# Patient Record
Sex: Female | Born: 1955 | Race: White | Hispanic: No | Marital: Married | Smoking: Former smoker
Health system: Southern US, Community
[De-identification: ages and names within clinical notes are randomized; demographics above are authoritative.]

## PROBLEM LIST (undated history)

## (undated) DIAGNOSIS — Z9289 Personal history of other medical treatment: Secondary | ICD-10-CM

## (undated) DIAGNOSIS — R600 Localized edema: Secondary | ICD-10-CM

## (undated) DIAGNOSIS — I10 Essential (primary) hypertension: Secondary | ICD-10-CM

## (undated) DIAGNOSIS — F319 Bipolar disorder, unspecified: Secondary | ICD-10-CM

## (undated) DIAGNOSIS — E039 Hypothyroidism, unspecified: Secondary | ICD-10-CM

## (undated) HISTORY — DX: Personal history of other medical treatment: Z92.89

## (undated) HISTORY — DX: Bipolar disorder, unspecified: F31.9

## (undated) HISTORY — DX: Hypothyroidism, unspecified: E03.9

## (undated) HISTORY — PX: BREAST EXCISIONAL BIOPSY: SUR124

## (undated) HISTORY — PX: WISDOM TOOTH EXTRACTION: SHX21

## (undated) HISTORY — PX: BREAST BIOPSY: SHX20

## (undated) HISTORY — DX: Localized edema: R60.0

## (undated) HISTORY — DX: Essential (primary) hypertension: I10

---

## 1999-04-18 ENCOUNTER — Other Ambulatory Visit: Admission: RE | Admit: 1999-04-18 | Discharge: 1999-04-18 | Payer: Self-pay | Admitting: Obstetrics and Gynecology

## 1999-06-04 ENCOUNTER — Emergency Department (HOSPITAL_COMMUNITY): Admission: EM | Admit: 1999-06-04 | Discharge: 1999-06-04 | Payer: Self-pay | Admitting: Internal Medicine

## 1999-12-05 ENCOUNTER — Encounter: Payer: Self-pay | Admitting: Obstetrics and Gynecology

## 1999-12-05 ENCOUNTER — Encounter: Admission: RE | Admit: 1999-12-05 | Discharge: 1999-12-05 | Payer: Self-pay | Admitting: Obstetrics and Gynecology

## 1999-12-08 ENCOUNTER — Encounter: Admission: RE | Admit: 1999-12-08 | Discharge: 1999-12-08 | Payer: Self-pay | Admitting: Obstetrics and Gynecology

## 1999-12-08 ENCOUNTER — Encounter: Payer: Self-pay | Admitting: Obstetrics and Gynecology

## 2000-03-01 ENCOUNTER — Other Ambulatory Visit: Admission: RE | Admit: 2000-03-01 | Discharge: 2000-03-01 | Payer: Self-pay | Admitting: Obstetrics and Gynecology

## 2000-09-03 ENCOUNTER — Ambulatory Visit (HOSPITAL_COMMUNITY): Admission: RE | Admit: 2000-09-03 | Discharge: 2000-09-03 | Payer: Self-pay | Admitting: Gastroenterology

## 2000-12-27 ENCOUNTER — Encounter: Payer: Self-pay | Admitting: Obstetrics and Gynecology

## 2000-12-27 ENCOUNTER — Encounter: Admission: RE | Admit: 2000-12-27 | Discharge: 2000-12-27 | Payer: Self-pay | Admitting: Obstetrics and Gynecology

## 2001-01-01 ENCOUNTER — Encounter: Admission: RE | Admit: 2001-01-01 | Discharge: 2001-01-01 | Payer: Self-pay | Admitting: Obstetrics and Gynecology

## 2001-01-01 ENCOUNTER — Encounter: Payer: Self-pay | Admitting: Obstetrics and Gynecology

## 2001-03-04 ENCOUNTER — Other Ambulatory Visit: Admission: RE | Admit: 2001-03-04 | Discharge: 2001-03-04 | Payer: Self-pay | Admitting: Obstetrics and Gynecology

## 2001-05-17 ENCOUNTER — Encounter: Admission: RE | Admit: 2001-05-17 | Discharge: 2001-05-17 | Payer: Self-pay | Admitting: Obstetrics and Gynecology

## 2001-05-17 ENCOUNTER — Encounter: Payer: Self-pay | Admitting: Obstetrics and Gynecology

## 2002-01-08 ENCOUNTER — Encounter: Admission: RE | Admit: 2002-01-08 | Discharge: 2002-01-08 | Payer: Self-pay | Admitting: Obstetrics and Gynecology

## 2002-01-08 ENCOUNTER — Encounter: Payer: Self-pay | Admitting: Obstetrics and Gynecology

## 2003-01-12 ENCOUNTER — Encounter: Admission: RE | Admit: 2003-01-12 | Discharge: 2003-01-12 | Payer: Self-pay | Admitting: Obstetrics and Gynecology

## 2003-01-12 ENCOUNTER — Encounter: Payer: Self-pay | Admitting: Obstetrics and Gynecology

## 2003-06-29 ENCOUNTER — Encounter: Payer: Self-pay | Admitting: Obstetrics and Gynecology

## 2003-06-29 ENCOUNTER — Encounter: Admission: RE | Admit: 2003-06-29 | Discharge: 2003-06-29 | Payer: Self-pay | Admitting: Obstetrics and Gynecology

## 2004-03-09 ENCOUNTER — Encounter: Admission: RE | Admit: 2004-03-09 | Discharge: 2004-03-09 | Payer: Self-pay | Admitting: Obstetrics and Gynecology

## 2004-06-09 ENCOUNTER — Encounter: Admission: RE | Admit: 2004-06-09 | Discharge: 2004-06-09 | Payer: Self-pay | Admitting: Obstetrics and Gynecology

## 2004-09-23 HISTORY — PX: US ECHOCARDIOGRAPHY: HXRAD669

## 2005-05-24 ENCOUNTER — Encounter: Admission: RE | Admit: 2005-05-24 | Discharge: 2005-05-24 | Payer: Self-pay | Admitting: Internal Medicine

## 2006-05-28 ENCOUNTER — Encounter: Admission: RE | Admit: 2006-05-28 | Discharge: 2006-05-28 | Payer: Self-pay | Admitting: Obstetrics and Gynecology

## 2007-02-08 ENCOUNTER — Encounter: Admission: RE | Admit: 2007-02-08 | Discharge: 2007-02-08 | Payer: Self-pay | Admitting: Obstetrics and Gynecology

## 2007-07-04 ENCOUNTER — Encounter: Admission: RE | Admit: 2007-07-04 | Discharge: 2007-07-04 | Payer: Self-pay | Admitting: Obstetrics and Gynecology

## 2008-07-08 ENCOUNTER — Encounter: Admission: RE | Admit: 2008-07-08 | Discharge: 2008-07-08 | Payer: Self-pay | Admitting: Obstetrics and Gynecology

## 2009-07-13 ENCOUNTER — Encounter: Admission: RE | Admit: 2009-07-13 | Discharge: 2009-07-13 | Payer: Self-pay | Admitting: Obstetrics and Gynecology

## 2010-07-13 ENCOUNTER — Ambulatory Visit: Payer: Self-pay | Admitting: Cardiovascular Disease

## 2010-07-13 ENCOUNTER — Encounter: Admission: RE | Admit: 2010-07-13 | Discharge: 2010-07-13 | Payer: Self-pay | Admitting: Obstetrics and Gynecology

## 2010-07-26 ENCOUNTER — Telehealth (INDEPENDENT_AMBULATORY_CARE_PROVIDER_SITE_OTHER): Payer: Self-pay | Admitting: *Deleted

## 2010-07-27 ENCOUNTER — Encounter: Payer: Self-pay | Admitting: Cardiology

## 2010-07-27 ENCOUNTER — Encounter (HOSPITAL_COMMUNITY)
Admission: RE | Admit: 2010-07-27 | Discharge: 2010-10-07 | Payer: Self-pay | Source: Home / Self Care | Admitting: Cardiovascular Disease

## 2010-07-27 ENCOUNTER — Ambulatory Visit: Payer: Self-pay | Admitting: Cardiology

## 2010-07-27 ENCOUNTER — Ambulatory Visit: Payer: Self-pay

## 2010-07-27 HISTORY — PX: CARDIOVASCULAR STRESS TEST: SHX262

## 2010-09-30 ENCOUNTER — Ambulatory Visit: Payer: Self-pay | Admitting: Cardiovascular Disease

## 2010-12-06 NOTE — Progress Notes (Signed)
Summary: nuc pre procedure  Phone Note Outgoing Call Call back at Home Phone [phone]   Call placed by: Cathlyn Parsons RN,  July 26, 2010 3:04 PM Call placed to: Patient Reason for Call: Confirm/change Appt Summary of Call: Reviewed information on Myoview Information Sheet (see scanned document for further details).  Spoke with patient.          Nuclear Med Background Indications for Stress Test: Evaluation for Ischemia   History: Myocardial Perfusion Study  History Comments: 05 MPS:NL  Symptoms: Chest Pain    Nuclear Pre-Procedure Cardiac Risk Factors: Hypertension

## 2010-12-06 NOTE — Assessment & Plan Note (Signed)
Summary: Cardiology Nuclear Testing  Nuclear Med Background Indications for Stress Test: Evaluation for Ischemia   History: Myocardial Perfusion Study  History Comments: 05 MPS:NL  Symptoms: Chest Pain, Chest Pain with Exertion, DOE, Fatigue with Exertion, Rapid HR    Nuclear Pre-Procedure Cardiac Risk Factors: Family History - CAD, History of Smoking, Hypertension, Lipids Caffeine/Decaff Intake: None NPO After: 8:00 PM Lungs: Clear IV 0.9% NS with Angio Cath: 22g     IV Site: R Hand IV Started by: Irean Hong, RN Chest Size (in) 36-38     Cup Size C     Height (in): 67 Weight (lb): 179 BMI: 28.14 Tech Comments: Held toprol 24 hrs.  Nuclear Med Study 1 or 2 day study:  1 day     Stress Test Type:  Stress Reading MD:  Marca Ancona, MD     Referring MD:  Demetrius Charity Nahser Resting Radionuclide:  Technetium 20m Tetrofosmin     Resting Radionuclide Dose:  11 mCi  Stress Radionuclide:  Technetium 43m Tetrofosmin     Stress Radionuclide Dose:  33 mCi   Stress Protocol Exercise Time (min):  6:15 min     Max HR:  150 bpm     Predicted Max HR:  166 bpm  Max Systolic BP: 174 mm Hg     Percent Max HR:  90.36 %     METS: 7.3 Rate Pressure Product:  [address]    Stress Test Technologist:  Irean Hong,  RN     Nuclear Technologist:  Domenic Polite, CNMT  Rest Procedure  Myocardial perfusion imaging was performed at rest 45 minutes following the intravenous administration of Technetium 37m Tetrofosmin.  Stress Procedure  The patient exercised for 6 minutes and 15 seconds, RPE=15.  The patient stopped due to DOE,  and complained of chest tightness 3/10.  There were nonspecific  ST-T wave changes with exercise, rare PVC. The baseline EKG had nonspecific T-wave changes.  Technetium 79m Tetrofosmin was injected at peak exercise and myocardial perfusion imaging was performed after a brief delay.  QPS Raw Data Images:  Normal; no motion artifact; normal heart/lung ratio. Stress Images:  Normal  homogeneous uptake in all areas of the myocardium. Rest Images:  Normal homogeneous uptake in all areas of the myocardium. Subtraction (SDS):  There is no evidence of scar or ischemia. Transient Ischemic Dilatation:  .98  (Normal <1.22)  Lung/Heart Ratio:  .29  (Normal <0.45)  Quantitative Gated Spect Images QGS EDV:  56 ml QGS ESV:  14 ml QGS EF:  74 % QGS cine images:  Normal wall motion.    Overall Impression  Exercise Capacity: Fair exercise capacity. BP Response: Normal blood pressure response. Clinical Symptoms: Chest tightness.  ECG Impression: Insignificant upsloping ST segment depression. Overall Impression: Normal stress nuclear study.

## 2011-03-24 NOTE — Procedures (Signed)
[facility]  Patient:    Kristina Tran, Kristina Tran                      MRN: [id number] Proc. Date: 09/03/00 Attending:  Verlin Grills, M.D. CC:         Malachi Pro. Ambrose Mantle, M.D.  Thora Lance, M.D.   Procedure Report  REFERRING PHYSICIAN:  Malachi Pro. Ambrose Mantle, M.D.  PROCEDURE:  Colonoscopy.  PROCEDURE INDICATION:  Ms. Kristina Tran is a 55 year old female.  She denies a personal or family history for the presence of colorectal cancer. Her mother has undergone colonoscopic exams to remove neoplastic polyps to prevent colon cancer.  She has a maternal aunt, who was diagnosed with colon cancer in her 74s.  Ms. Kristina Tran is referred for colorectal neoplasia screening based on her mothers family history of neoplastic but noncancerous colon polyps.  Ms. Kristina Tran viewed our colonoscopy education film, and I discussed with her the complications associated with colonoscopy and polypectomy, including intestinal bleeding and intestinal perforation.  Ms. Kristina Tran has signed the operative permit.  MEDICATION ALLERGIES:  PENICILLIN and MYCIN DRUGS.  CHRONIC MEDICATIONS:  Wellbutrin, Depakote, Synthroid, and birth control pills.  PAST MEDICAL HISTORY:  Bipolar disorder, lithium-induced polydipsia, hypothyroidism, benign breast biopsy performed by Sandria Bales. Ezzard Standing, M.D.  ENDOSCOPIST:  Verlin Grills, M.D.  PREMEDICATION:  Demerol 100 mg, Versed 15 mg.  ENDOSCOPE:  Olympus pediatric colonoscope.  DESCRIPTION OF PROCEDURE:  After obtaining informed consent, the patient was placed in the left lateral decubitus position.  I administered intravenous Demerol and intravenous Versed to achieve conscious sedation for the procedure.  The patients blood pressure, oxygen saturation, and cardiac rhythm were monitored throughout the procedure and documented in the medical record.  Anal inspection was normal.  Digital rectal exam was normal.  The  Olympus pediatric video colonoscope was introduced into the rectum and under direct vision advanced to the cecum, as identified by a normal-appearing ileocecal valve.  Colonic preparation for the exam today was excellent.  Rectum normal.  Sigmoid colon and descending colon normal.  Splenic flexure normal.  Transverse colon normal.  Hepatic flexure normal.  Ascending colon normal.  Cecum and ileocecal valve normal.  ASSESSMENT:  Normal proctocolonoscopy to the cecum.  There is no evidence of presence of colorectal neoplasia.  RECOMMENDATIONS:  Repeat colonoscopy at age 29. DD:  09/03/00 TD:  09/03/00 Job: [id number]

## 2011-05-22 ENCOUNTER — Other Ambulatory Visit: Payer: Self-pay | Admitting: *Deleted

## 2011-05-22 DIAGNOSIS — Z1231 Encounter for screening mammogram for malignant neoplasm of breast: Secondary | ICD-10-CM

## 2011-07-17 ENCOUNTER — Ambulatory Visit: Payer: Self-pay

## 2011-07-21 ENCOUNTER — Other Ambulatory Visit: Payer: Self-pay | Admitting: Internal Medicine

## 2011-07-21 ENCOUNTER — Ambulatory Visit
Admission: RE | Admit: 2011-07-21 | Discharge: 2011-07-21 | Disposition: A | Discharge status: Home / Self Care | Payer: BC Managed Care – PPO | Source: Ambulatory Visit | Attending: *Deleted | Admitting: *Deleted

## 2011-07-21 DIAGNOSIS — Z1231 Encounter for screening mammogram for malignant neoplasm of breast: Secondary | ICD-10-CM

## 2011-08-05 ENCOUNTER — Other Ambulatory Visit: Payer: Self-pay | Admitting: Cardiovascular Disease

## 2011-08-10 ENCOUNTER — Encounter: Payer: Self-pay | Admitting: Cardiovascular Disease

## 2011-08-10 ENCOUNTER — Other Ambulatory Visit: Payer: Self-pay | Admitting: *Deleted

## 2011-08-10 MED ORDER — METOPROLOL SUCCINATE ER 25 MG PO TB24: 25.0000 mg | ORAL_TABLET | Freq: Every day | ORAL | Status: DC

## 2011-09-22 ENCOUNTER — Ambulatory Visit (INDEPENDENT_AMBULATORY_CARE_PROVIDER_SITE_OTHER): Payer: BC Managed Care – PPO | Admitting: Cardiovascular Disease

## 2011-09-22 ENCOUNTER — Encounter: Payer: Self-pay | Admitting: Cardiovascular Disease

## 2011-09-22 DIAGNOSIS — E039 Hypothyroidism, unspecified: Secondary | ICD-10-CM

## 2011-09-22 DIAGNOSIS — I10 Essential (primary) hypertension: Secondary | ICD-10-CM | POA: Insufficient documentation

## 2011-09-22 NOTE — Assessment & Plan Note (Signed)
[name] is doing very well from a cardiac standpoint. Her blood pressure remains well controlled. We'll continue with her same medications.

## 2011-09-22 NOTE — Progress Notes (Addendum)
Meta Hatchet Date of Birth  02/21/1956 [facility] [address]    [address] [place], [place]  [address] [phone]  Fax  [phone]  History of Present Illness:  [name] is a 55 y.o. female with a Hx of HTN, hypothyroidsim, bipolar disease.  She doesn't have any complaints. She's been able to do all of her normal activities without any significant problems.  Current Outpatient Prescriptions on File Prior to Visit  Medication Sig Dispense Refill  . ARIPiprazole (ABILIFY) 10 MG tablet Take 10 mg by mouth daily.        Marland Kitchen buPROPion (WELLBUTRIN XL) 150 MG 24 hr tablet Take 150 mg by mouth daily.        . divalproex (DEPAKOTE) 500 MG DR tablet Take by mouth. Taking 7 Pills QID      . hydrochlorothiazide (MICROZIDE) 12.5 MG capsule Take 12.5 mg by mouth as needed.        Marland Kitchen levothyroxine (SYNTHROID, LEVOTHROID) 100 MCG tablet Take 100 mcg by mouth daily.        Marland Kitchen LORazepam (ATIVAN) 0.5 MG tablet Take 0.5 mg by mouth every 8 (eight) hours as needed.        . metoprolol succinate (TOPROL-XL) 25 MG 24 hr tablet Take 1 tablet (25 mg total) by mouth daily.  90 tablet  3  . raloxifene (EVISTA) 60 MG tablet Take 60 mg by mouth daily.        . temazepam (RESTORIL) 30 MG capsule Take 30 mg by mouth at bedtime as needed.          Allergies  Allergen Reactions  . Penicillins     REACTION: Rash  . Sulfonamide Derivatives     REACTION: Vomiting    Past Medical History  Diagnosis Date  . Hypertension   . Hypothyroidism   . Bipolar 1 disorder   . Chest pain   . Leg edema     Past Surgical History  Procedure Date  . Breast biopsy   . US echocardiography 09/23/2004    EF 55-60%  . Cardiovascular stress test 07/27/2010    EF 74%. NORMAL  . Wisdom tooth extraction     History  Smoking status  . Former Smoker  . Quit date: 11/06/1981  Smokeless tobacco  . Not on file    History  Alcohol Use No    Family History  Problem Relation Age of Onset  . Breast cancer Mother    . Hypertension Mother   . Breast cancer Sister     Reviw of Systems:  Reviewed in the HPI.  All other systems are negative.  Physical Exam: BP 124/87  Pulse 84  Ht 5\' 6"  (1.676 m)  Wt 177 lb 12.8 oz (80.65 kg)  BMI 28.70 kg/m2 The patient is alert and oriented x 3.  The mood and affect are normal.   Skin: warm and dry.  Color is normal.    HEENT:   [place]/AT, normal carotids, no JVD  Lungs: clear   Heart: RR, no murmurs    Abdomen: soft, +BS  Extremities:  Trace edema, R> L  Neuro:  Non focal    ECG: NSR, N ST/T wave abn.  Assessment / Plan:     e for ECG

## 2011-09-22 NOTE — Patient Instructions (Signed)
Your physician wants you to follow-up in: 1 year. You will receive a reminder letter in the mail two months in advance. If you don't receive a letter, please call our office to schedule the follow-up appointment.  

## 2012-02-15 ENCOUNTER — Other Ambulatory Visit: Payer: Self-pay | Admitting: Obstetrics and Gynecology

## 2012-02-15 DIAGNOSIS — N6320 Unspecified lump in the left breast, unspecified quadrant: Secondary | ICD-10-CM

## 2012-02-16 ENCOUNTER — Ambulatory Visit
Admission: RE | Admit: 2012-02-16 | Discharge: 2012-02-16 | Disposition: A | Discharge status: Home / Self Care | Payer: BC Managed Care – PPO | Source: Ambulatory Visit | Attending: Obstetrics and Gynecology | Admitting: Obstetrics and Gynecology

## 2012-02-16 DIAGNOSIS — N6320 Unspecified lump in the left breast, unspecified quadrant: Secondary | ICD-10-CM

## 2012-05-17 ENCOUNTER — Other Ambulatory Visit: Payer: Self-pay | Admitting: Obstetrics and Gynecology

## 2012-05-17 DIAGNOSIS — Z1231 Encounter for screening mammogram for malignant neoplasm of breast: Secondary | ICD-10-CM

## 2012-07-22 ENCOUNTER — Ambulatory Visit
Admission: RE | Admit: 2012-07-22 | Discharge: 2012-07-22 | Disposition: A | Discharge status: Home / Self Care | Payer: BC Managed Care – PPO | Source: Ambulatory Visit | Attending: Obstetrics and Gynecology | Admitting: Obstetrics and Gynecology

## 2012-07-22 DIAGNOSIS — Z1231 Encounter for screening mammogram for malignant neoplasm of breast: Secondary | ICD-10-CM

## 2012-08-29 ENCOUNTER — Other Ambulatory Visit: Payer: Self-pay | Admitting: *Deleted

## 2012-08-29 MED ORDER — METOPROLOL SUCCINATE ER 25 MG PO TB24: 25.0000 mg | ORAL_TABLET | Freq: Every day | ORAL | Status: DC

## 2012-08-29 NOTE — Telephone Encounter (Signed)
Fax Received. Refill Completed. Kristina Tran (R.M.A)   

## 2012-09-24 ENCOUNTER — Telehealth: Payer: Self-pay | Admitting: Cardiovascular Disease

## 2012-09-24 NOTE — Telephone Encounter (Signed)
Pt will have done with her ov,

## 2012-09-24 NOTE — Telephone Encounter (Signed)
New problem:    Patient would like to be set up for lab work.

## 2012-10-02 ENCOUNTER — Encounter: Payer: Self-pay | Admitting: Cardiovascular Disease

## 2012-10-02 ENCOUNTER — Ambulatory Visit (INDEPENDENT_AMBULATORY_CARE_PROVIDER_SITE_OTHER): Payer: BC Managed Care – PPO | Admitting: Cardiovascular Disease

## 2012-10-02 VITALS — BP 104/78 | HR 85 | Ht 66.5 in | Wt 168.4 lb

## 2012-10-02 DIAGNOSIS — E785 Hyperlipidemia, unspecified: Secondary | ICD-10-CM

## 2012-10-02 DIAGNOSIS — F319 Bipolar disorder, unspecified: Secondary | ICD-10-CM | POA: Insufficient documentation

## 2012-10-02 DIAGNOSIS — I10 Essential (primary) hypertension: Secondary | ICD-10-CM

## 2012-10-02 LAB — HEPATIC FUNCTION PANEL
AST: 19 U/L (ref 0–37)
Alkaline Phosphatase: 43 U/L (ref 39–117)
Total Bilirubin: 0.6 mg/dL (ref 0.3–1.2)

## 2012-10-02 LAB — BASIC METABOLIC PANEL
BUN: 18 mg/dL (ref 6–23)
CO2: 27 mEq/L (ref 19–32)
Chloride: 104 mEq/L (ref 96–112)
Creatinine, Ser: 1.2 mg/dL (ref 0.4–1.2)
Glucose, Bld: 96 mg/dL (ref 70–99)

## 2012-10-02 LAB — LIPID PANEL: Total CHOL/HDL Ratio: 4

## 2012-10-02 NOTE — Assessment & Plan Note (Signed)
Kristina Tran is doing well. We will continue with her same medications. We'll check fasting lipids, hepatic profile, and basic metabolic profile on her today.  At the request of her psychiatrist at [facility] we will also check a valproic acid level. We'll fax those levels to him.

## 2012-10-02 NOTE — Progress Notes (Signed)
Meta Hatchet Date of Birth  01-Oct-1956 [facility] [name]    [address] [place], [place]  [address] [phone]  Fax  [phone]  History of Present Illness:  Kristina Tran is a 56 y.o. female with a Hx of HTN, hypothyroidsim, bipolar disease.  She doesn't have any complaints. She's been able to do all of her normal activities without any significant problems.  She has lost 15 lbs since I last saw her.  She is walking and has been watching her diet.    Her psychiatric doctor at [facility] would like for [place] to draw a valproic acid level and fax to him.  ( Dr. Arsenio Katz, [facility], fax - [phone])    Current Outpatient Prescriptions on File Prior to Visit  Medication Sig Dispense Refill  . buPROPion (WELLBUTRIN XL) 150 MG 24 hr tablet Take 150 mg by mouth daily.        . divalproex (DEPAKOTE) 500 MG DR tablet Take by mouth. Taking 7 Pills QID      . hydrochlorothiazide (MICROZIDE) 12.5 MG capsule Take 12.5 mg by mouth as needed.        Marland Kitchen levothyroxine (SYNTHROID, LEVOTHROID) 100 MCG tablet Take 100 mcg by mouth daily.        Marland Kitchen LORazepam (ATIVAN) 0.5 MG tablet Take 0.5 mg by mouth every 8 (eight) hours as needed.        . metoprolol succinate (TOPROL-XL) 25 MG 24 hr tablet Take 1 tablet (25 mg total) by mouth daily.  90 tablet  3  . raloxifene (EVISTA) 60 MG tablet Take 60 mg by mouth daily.        . risperiDONE (RISPERDAL) 2 MG tablet Take 1 mg by mouth every 3 (three) days.      . rosuvastatin (CRESTOR) 10 MG tablet Take 10 mg by mouth at bedtime.      . temazepam (RESTORIL) 30 MG capsule Take 30 mg by mouth at bedtime as needed.        . ARIPiprazole (ABILIFY) 10 MG tablet Take 10 mg by mouth daily.          Allergies  Allergen Reactions  . Penicillins     REACTION: Rash  . Sulfonamide Derivatives     REACTION: Vomiting    Past Medical History  Diagnosis Date  . Hypertension   . Hypothyroidism   . Bipolar 1 disorder   . Chest pain   . Leg edema     Past Surgical  History  Procedure Date  . Breast biopsy   . US echocardiography 09/23/2004    EF 55-60%  . Cardiovascular stress test 07/27/2010    EF 74%. NORMAL  . Wisdom tooth extraction     History  Smoking status  . Former Smoker  . Quit date: 11/06/1981  Smokeless tobacco  . Not on file    History  Alcohol Use No    Family History  Problem Relation Age of Onset  . Breast cancer Mother   . Hypertension Mother   . Breast cancer Sister     Reviw of Systems:  Reviewed in the HPI.  All other systems are negative.  Physical Exam: BP 104/78  Pulse 85  Ht 5' 6.5" (1.689 m)  Wt 168 lb 6.4 oz (76.386 kg)  BMI 26.77 kg/m2 The patient is alert and oriented x 3.  The mood and affect are normal.   Skin: warm and dry.  Color is normal.    HEENT:   /AT,  normal carotids, no JVD  Lungs: clear   Heart: RR, no murmurs    Abdomen: soft, +BS  Extremities:  Trace edema, R> L  Neuro:  Non focal    ECG: 10/02/2012-normal sinus rhythm at 86 beats a minute. She has nonspecific ST and T wave ab normality. The EKG is unchanged from her previous tracing one year ago.  Assessment / Plan:     e for ECG

## 2012-10-02 NOTE — Patient Instructions (Addendum)
Your physician wants you to follow-up in: 1 year You will receive a reminder letter in the mail two months in advance. If you don't receive a letter, please call our office to schedule the follow-up appointment.   Your physician recommends that you return for a FASTING lipid profile: today and in 1 year   Your physician recommends that you continue on your current medications as directed. Please refer to the Current Medication list given to you today.   

## 2012-10-02 NOTE — Assessment & Plan Note (Signed)
She is seen by her doctor at [facility]. We will draw a valproic acid level in fax these results to him. She'll followup with her doctor at [facility]

## 2012-10-04 ENCOUNTER — Telehealth: Payer: Self-pay | Admitting: *Deleted

## 2012-10-04 MED ORDER — ROSUVASTATIN CALCIUM 20 MG PO TABS: 20.0000 mg | ORAL_TABLET | Freq: Every day | ORAL | Status: DC

## 2012-10-04 NOTE — Telephone Encounter (Signed)
Notes Recorded by Lesle Chris, LPN on 14/78/2956 at 10:45 AM Patient notified. Will send new rx to pharm at pt request.  Will also fax labs to MD at [facility] as requested below.

## 2012-10-04 NOTE — Telephone Encounter (Signed)
Message copied by Lesle Chris on Fri Oct 04, 2012 10:45 AM ------      Message from: Bunker Stephan Nelis, [place]      Created: Fri Oct 04, 2012  8:33 AM       Fax Valproic acid level to her doctor at [facility].  Fax number is in my note.      Increase crestor to 20.  She needs to be more strict with her diet and increase her exercise.

## 2012-10-18 ENCOUNTER — Other Ambulatory Visit: Payer: Self-pay | Admitting: *Deleted

## 2012-10-18 NOTE — Telephone Encounter (Signed)
Opened in Error.

## 2012-11-01 ENCOUNTER — Encounter: Payer: Self-pay | Admitting: Cardiovascular Disease

## 2013-04-17 ENCOUNTER — Other Ambulatory Visit (HOSPITAL_COMMUNITY): Payer: Self-pay | Admitting: *Deleted

## 2013-04-17 MED ORDER — ROSUVASTATIN CALCIUM 20 MG PO TABS: 20.0000 mg | ORAL_TABLET | Freq: Every day | ORAL | Status: DC

## 2013-04-17 NOTE — Telephone Encounter (Signed)
Fax Received. Refill Completed. Kristina Tran (R.M.A)   

## 2013-06-16 ENCOUNTER — Other Ambulatory Visit: Payer: Self-pay

## 2013-06-16 DIAGNOSIS — Z1231 Encounter for screening mammogram for malignant neoplasm of breast: Secondary | ICD-10-CM

## 2013-07-23 ENCOUNTER — Ambulatory Visit: Payer: BC Managed Care – PPO

## 2013-11-07 ENCOUNTER — Other Ambulatory Visit: Payer: Self-pay | Admitting: *Deleted

## 2013-11-07 MED ORDER — METOPROLOL SUCCINATE ER 25 MG PO TB24: 25.0000 mg | ORAL_TABLET | Freq: Every day | ORAL | Status: DC

## 2013-11-17 ENCOUNTER — Encounter: Payer: Self-pay | Admitting: Cardiology

## 2013-11-17 ENCOUNTER — Encounter: Payer: Self-pay | Admitting: Cardiovascular Disease

## 2013-11-25 ENCOUNTER — Encounter: Payer: Self-pay | Admitting: Cardiovascular Disease

## 2013-11-25 ENCOUNTER — Ambulatory Visit (INDEPENDENT_AMBULATORY_CARE_PROVIDER_SITE_OTHER): Payer: BC Managed Care – PPO | Admitting: Cardiovascular Disease

## 2013-11-25 VITALS — BP 118/76 | HR 93 | Ht 66.5 in | Wt 159.0 lb

## 2013-11-25 DIAGNOSIS — E785 Hyperlipidemia, unspecified: Secondary | ICD-10-CM

## 2013-11-25 DIAGNOSIS — I10 Essential (primary) hypertension: Secondary | ICD-10-CM

## 2013-11-25 LAB — BASIC METABOLIC PANEL
BUN: 20 mg/dL (ref 6–23)
CALCIUM: 9.8 mg/dL (ref 8.4–10.5)
CHLORIDE: 100 meq/L (ref 96–112)
CO2: 34 meq/L — AB (ref 19–32)
CREATININE: 1.7 mg/dL — AB (ref 0.4–1.2)
GFR: 32.22 mL/min — ABNORMAL LOW (ref >60.00)
GLUCOSE: 87 mg/dL (ref 70–99)
Potassium: 3.3 mEq/L — ABNORMAL LOW (ref 3.5–5.1)
Sodium: 144 mEq/L (ref 135–145)

## 2013-11-25 LAB — HEPATIC FUNCTION PANEL
ALK PHOS: 39 U/L (ref 39–117)
ALT: 17 U/L (ref 0–35)
AST: 18 U/L (ref 0–37)
Albumin: 3.6 g/dL (ref 3.5–5.2)
BILIRUBIN TOTAL: 0.7 mg/dL (ref 0.3–1.2)
Bilirubin, Direct: 0.1 mg/dL (ref 0.0–0.3)
Total Protein: 6.6 g/dL (ref 6.0–8.3)

## 2013-11-25 LAB — LIPID PANEL
CHOL/HDL RATIO: 2
Cholesterol: 153 mg/dL (ref 0–200)
HDL: 63.6 mg/dL (ref >39.00)
LDL CALC: 66 mg/dL (ref 0–99)
TRIGLYCERIDES: 118 mg/dL (ref 0.0–149.0)
VLDL: 23.6 mg/dL (ref 0.0–40.0)

## 2013-11-25 MED ORDER — METOPROLOL SUCCINATE ER 25 MG PO TB24: 25.0000 mg | ORAL_TABLET | Freq: Every day | ORAL | Status: DC

## 2013-11-25 NOTE — Assessment & Plan Note (Signed)
[name]  seems to be doing well. Heart rate is a little bit faster. She ran out of Toprol-XL in one year since she was last seen here.  Will restart Toprol-XL. Continue with her other medications.

## 2013-11-25 NOTE — Progress Notes (Signed)
Meta Hatchet Date of Birth  Nov 10, 1955 [facility] [address]    [address] [place], [place]  [address] [phone]  Fax  [phone]  History of Present Illness:  Kristina Tran is a 58 y.o. female with a Hx of HTN, hypothyroidsim, bipolar disease.  She doesn't have any complaints. She's been able to do all of her normal activities without any significant problems.  She has lost 15 lbs since I last saw her.  She is walking and has been watching her diet.    Her psychiatric doctor at [facility] would like for [place] to draw a valproic acid level and fax to him.  (Dr. Arsenio Katz, [facility], fax - [phone])    Jan. 20, 2015:  Kristina Tran is doing well.  She has lost 9 lbs since I last saw her.  She is exercising 2-3 times a week.   No dyspnea, no Chest pain.    Current Outpatient Prescriptions on File Prior to Visit  Medication Sig Dispense Refill  . ARIPiprazole (ABILIFY) 10 MG tablet Take 10 mg by mouth daily.        Marland Kitchen buPROPion (WELLBUTRIN XL) 150 MG 24 hr tablet Take 150 mg by mouth daily.        . divalproex (DEPAKOTE) 500 MG DR tablet Take by mouth. Taking 7 Pills QID      . hydrochlorothiazide (MICROZIDE) 12.5 MG capsule Take 12.5 mg by mouth as needed.        Marland Kitchen levothyroxine (SYNTHROID, LEVOTHROID) 100 MCG tablet Take 100 mcg by mouth daily.        Marland Kitchen LORazepam (ATIVAN) 0.5 MG tablet Take 0.5 mg by mouth every 8 (eight) hours as needed.        . metoprolol succinate (TOPROL-XL) 25 MG 24 hr tablet Take 1 tablet (25 mg total) by mouth daily.  90 tablet  0  . raloxifene (EVISTA) 60 MG tablet Take 60 mg by mouth daily.        . rosuvastatin (CRESTOR) 20 MG tablet Take 1 tablet (20 mg total) by mouth at bedtime.  30 tablet  6  . temazepam (RESTORIL) 30 MG capsule Take 30 mg by mouth at bedtime as needed.         No current facility-administered medications on file prior to visit.    Allergies  Allergen Reactions  . Penicillins     REACTION: Rash  . Sulfonamide Derivatives     REACTION:  Vomiting    Past Medical History  Diagnosis Date  . Hypertension   . Hypothyroidism   . Bipolar 1 disorder   . Chest pain   . Leg edema     Past Surgical History  Procedure Laterality Date  . Breast biopsy    . US echocardiography  09/23/2004    EF 55-60%  . Cardiovascular stress test  07/27/2010    EF 74%. NORMAL  . Wisdom tooth extraction      History  Smoking status  . Former Smoker  . Quit date: 11/06/1981  Smokeless tobacco  . Not on file    History  Alcohol Use No    Family History  Problem Relation Age of Onset  . Breast cancer Mother   . Hypertension Mother   . Breast cancer Sister     Reviw of Systems:  Reviewed in the HPI.  All other systems are negative.  Physical Exam: BP 118/76  Pulse 93  Ht 5' 6.5" (1.689 m)  Wt 159 lb (72.122 kg)  BMI  25.28 kg/m2 The patient is alert and oriented x 3.  The mood and affect are normal.   Skin: warm and dry.  Color is normal.    HEENT:   [place]/AT, normal carotids, no JVD  Lungs: clear   Heart: RR, no murmurs    Abdomen: soft, +BS  Extremities:    Trace - 1+ dema, R> L  Neuro:  Non focal    ECG: Jan. 20, 2015:  NSR at 93.  NS ST / T wave abnormality. .  Assessment / Plan:

## 2013-11-25 NOTE — Patient Instructions (Signed)
Your physician recommends that you return for a FASTING lipid profile: TODAY   Your physician recommends that you continue on your current medications as directed. Please refer to the Current Medication list given to you today.   Your physician wants you to follow-up in: 1 YEAR  You will receive a reminder letter in the mail two months in advance. If you don't receive a letter, please call our office to schedule the follow-up appointment.

## 2013-11-25 NOTE — Assessment & Plan Note (Signed)
We'll continue with the current dose of Crestor .  We will draw fasting lipids, basic metabolic profile, and hepatic profile today.

## 2013-11-26 ENCOUNTER — Telehealth: Payer: Self-pay | Admitting: *Deleted

## 2013-11-26 DIAGNOSIS — E876 Hypokalemia: Secondary | ICD-10-CM

## 2013-11-26 MED ORDER — POTASSIUM CHLORIDE CRYS ER 10 MEQ PO TBCR: 10.0000 meq | EXTENDED_RELEASE_TABLET | Freq: Every day | ORAL | Status: DC

## 2013-11-26 NOTE — Telephone Encounter (Signed)
kdur explained/ lab date given/ Pt verbalized understanding.

## 2013-11-26 NOTE — Telephone Encounter (Signed)
Message copied by Antony OdeaBRILEY, Alysen Smylie J on Wed Nov 26, 2013  4:58 PM ------      Message from: Vesta MixerNAHSER, PHILIP J      Created: Tue Nov 25, 2013  9:31 PM       Add kdur 10 meq a day recheck BMP in 1 month ------

## 2013-12-07 ENCOUNTER — Other Ambulatory Visit (HOSPITAL_COMMUNITY): Payer: Self-pay | Admitting: Cardiovascular Disease

## 2013-12-25 ENCOUNTER — Other Ambulatory Visit: Payer: BC Managed Care – PPO

## 2014-01-07 ENCOUNTER — Telehealth: Payer: Self-pay | Admitting: Cardiovascular Disease

## 2014-01-07 NOTE — Telephone Encounter (Signed)
New message  Patient calling just wanted the nurse to know Blood work was done this  am Dr. Wylene Simmerisovec office.

## 2014-02-09 ENCOUNTER — Other Ambulatory Visit: Payer: BC Managed Care – PPO

## 2014-06-04 ENCOUNTER — Other Ambulatory Visit (HOSPITAL_COMMUNITY): Payer: Self-pay | Admitting: Cardiovascular Disease

## 2014-10-07 ENCOUNTER — Other Ambulatory Visit (HOSPITAL_COMMUNITY): Payer: Self-pay | Admitting: Cardiovascular Disease

## 2014-11-19 ENCOUNTER — Encounter: Payer: Self-pay | Admitting: Cardiovascular Disease

## 2014-11-25 ENCOUNTER — Ambulatory Visit (INDEPENDENT_AMBULATORY_CARE_PROVIDER_SITE_OTHER): Payer: BLUE CROSS/BLUE SHIELD | Admitting: Cardiovascular Disease

## 2014-11-25 ENCOUNTER — Encounter: Payer: Self-pay | Admitting: Cardiovascular Disease

## 2014-11-25 VITALS — BP 94/82 | HR 85 | Ht 66.5 in | Wt 156.2 lb

## 2014-11-25 DIAGNOSIS — I10 Essential (primary) hypertension: Secondary | ICD-10-CM

## 2014-11-25 NOTE — Patient Instructions (Signed)
Your physician recommends that you continue on your current medications as directed. Please refer to the Current Medication list given to you today.  Your physician wants you to follow-up in: 1 year with Dr. Nahser.  You will receive a reminder letter in the mail two months in advance. If you don't receive a letter, please call our office to schedule the follow-up appointment.  

## 2014-11-25 NOTE — Progress Notes (Signed)
Cardiology Office Note   Date:  11/25/2014   ID:  Kristina Tran, DOB 03-Sep-1956, MRN [id number]  PCP:  Gaspar Garbe, MD  Cardiologist:   Vesta Mixer, MD   Chief Complaint  Patient presents with  . Follow-up    htn      History of Present Illness: Kristina Tran is a 59 y.o. female who presents for follow up Hx of HTN, hyperlipidemia , hypothyroidsim, bipolar disease.  Kristina Tran has done well .  No episodes of CP ,  Dyspnea.   Past Medical History  Diagnosis Date  . Hypertension   . Hypothyroidism   . Bipolar 1 disorder   . Chest pain   . Leg edema     Past Surgical History  Procedure Laterality Date  . Breast biopsy    . US echocardiography  09/23/2004    EF 55-60%  . Cardiovascular stress test  07/27/2010    EF 74%. NORMAL  . Wisdom tooth extraction       Current Outpatient Prescriptions  Medication Sig Dispense Refill  . buPROPion (WELLBUTRIN XL) 150 MG 24 hr tablet Take 150 mg by mouth daily.      . CRESTOR 20 MG tablet TAKE 1 TABLET BY MOUTH AT BEDTIME 30 tablet 1  . cyclobenzaprine (FLEXERIL) 5 MG tablet Take 5 mg by mouth every 8 (eight) hours as needed. FOR MUSCLE SPASMS  0  . divalproex (DEPAKOTE) 500 MG DR tablet Take by mouth. Taking 5 Pills QID    . hydrochlorothiazide (MICROZIDE) 12.5 MG capsule Take 12.5 mg by mouth as needed. FOR SWELLING    . LATUDA 20 MG TABS Take 20 mg by mouth daily.  1  . levothyroxine (SYNTHROID, LEVOTHROID) 100 MCG tablet Take 100 mcg by mouth daily.      Marland Kitchen LORazepam (ATIVAN) 0.5 MG tablet Take 0.5 mg by mouth every 8 (eight) hours as needed for sleep.     . metoprolol succinate (TOPROL-XL) 25 MG 24 hr tablet Take 1 tablet (25 mg total) by mouth daily. 90 tablet 3  . temazepam (RESTORIL) 30 MG capsule Take 30 mg by mouth at bedtime as needed for sleep.      No current facility-administered medications for this visit.    Allergies:   Penicillins and Sulfonamide derivatives    Social History:  The patient   reports that she quit smoking about 33 years ago. She does not have any smokeless tobacco history on file. She reports that she does not drink alcohol or use illicit drugs.   Family History:  The patient's family history includes Breast cancer in her mother and sister; Hypertension in her mother.    ROS:  Please see the history of present illness.   Otherwise, review of systems are positive for none.   All other systems are reviewed and negative.    PHYSICAL EXAM: VS:  BP 94/82 mmHg  Pulse 85  Ht 5' 6.5" (1.689 m)  Wt 156 lb 3.2 oz (70.852 kg)  BMI 24.84 kg/m2 , BMI Body mass index is 24.84 kg/(m^2). GEN: Well nourished, well developed, in no acute distress HEENT: normal Neck: no JVD, carotid bruits, or masses Cardiac: RRR; no murmurs, rubs, or gallops,no edema  Respiratory:  clear to auscultation bilaterally, normal work of breathing GI: soft, nontender, nondistended, + BS MS: no deformity or atrophy Skin: warm and dry, no rash Neuro:  Strength and sensation are intact Psych: euthymic mood, full affect   EKG:  EKG is ordered  today. The ekg ordered today demonstrates NSR at 85.    Recent Labs: No results found for requested labs within last 365 days.    Lipid Panel    Component Value Date/Time   CHOL 153 11/25/2013 1037   TRIG 118.0 11/25/2013 1037   HDL 63.60 11/25/2013 1037   CHOLHDL 2 11/25/2013 1037   VLDL 23.6 11/25/2013 1037   LDLCALC 66 11/25/2013 1037   LDLDIRECT 135.5 10/02/2012 0936      Wt Readings from Last 3 Encounters:  11/25/14 156 lb 3.2 oz (70.852 kg)  11/25/13 159 lb (72.122 kg)  10/02/12 168 lb 6.4 oz (76.386 kg)      Other studies Reviewed: Additional studies/ records that were reviewed today include: . Review of the above records demonstrates:    ASSESSMENT AND PLAN:  1.  HTN:  Her BP is low today.  Discussed holding metoprolol if her BP stays low.  2. Hyperlipidemia.  She has lost weight. conitnue meds.    Current medicines are  reviewed at length with the patient today.  The patient does not have concerns regarding medicines.  The following changes have been made:  no change  Labs/ tests ordered today include:  No orders of the defined types were placed in this encounter.     Disposition:   FU with me  in 1 hear    Signed, Nahser, Deloris PingPhilip J, MD  11/25/2014 10:56 AM    [facility] [address], [place], [place]  [address] Phone: [phone]) [phone]; Fax: [phone]

## 2014-12-14 ENCOUNTER — Other Ambulatory Visit: Payer: Self-pay | Admitting: *Deleted

## 2014-12-14 MED ORDER — METOPROLOL SUCCINATE ER 25 MG PO TB24: 25.0000 mg | ORAL_TABLET | Freq: Every day | ORAL | Status: DC

## 2014-12-21 ENCOUNTER — Other Ambulatory Visit (HOSPITAL_COMMUNITY): Payer: Self-pay | Admitting: Cardiovascular Disease

## 2014-12-21 NOTE — Telephone Encounter (Signed)
NEEDS APPT FOR REFILLS AND LABS

## 2015-02-22 ENCOUNTER — Other Ambulatory Visit (HOSPITAL_COMMUNITY): Payer: Self-pay | Admitting: Adult Health

## 2015-06-16 ENCOUNTER — Other Ambulatory Visit: Payer: Self-pay | Admitting: Internal Medicine

## 2015-06-16 DIAGNOSIS — R413 Other amnesia: Secondary | ICD-10-CM

## 2015-06-17 ENCOUNTER — Ambulatory Visit
Admission: RE | Admit: 2015-06-17 | Discharge: 2015-06-17 | Disposition: A | Discharge status: Home / Self Care | Payer: BLUE CROSS/BLUE SHIELD | Source: Ambulatory Visit | Attending: Internal Medicine | Admitting: Internal Medicine

## 2015-06-17 DIAGNOSIS — R413 Other amnesia: Secondary | ICD-10-CM

## 2015-06-27 ENCOUNTER — Other Ambulatory Visit (HOSPITAL_COMMUNITY): Payer: Self-pay | Admitting: Cardiovascular Disease

## 2016-01-12 ENCOUNTER — Ambulatory Visit: Payer: BLUE CROSS/BLUE SHIELD | Admitting: Cardiovascular Disease

## 2016-02-03 ENCOUNTER — Encounter: Payer: Self-pay | Admitting: Cardiovascular Disease

## 2016-02-03 ENCOUNTER — Ambulatory Visit (INDEPENDENT_AMBULATORY_CARE_PROVIDER_SITE_OTHER): Payer: BLUE CROSS/BLUE SHIELD | Admitting: Cardiovascular Disease

## 2016-02-03 VITALS — BP 120/86 | HR 80 | Ht 66.5 in | Wt 172.8 lb

## 2016-02-03 DIAGNOSIS — I10 Essential (primary) hypertension: Secondary | ICD-10-CM

## 2016-02-03 NOTE — Patient Instructions (Signed)

## 2016-02-03 NOTE — Progress Notes (Signed)
Cardiology Office Note   Date:  02/03/2016   ID:  Kristina HatchetSarah G Rathe, DOB 06/22/1956, MRN [id number]  PCP:  Gaspar Garbeichard W Tisovec, MD  Cardiologist:   Elease HashimotoNahser, Deloris PingPhilip J, MD   Chief Complaint  Patient presents with  . Follow-up    LEG SWELLING, NO CHEST PAIN OR SOB      History of Present Illness: Kristina Tran is a 60 y.o. female who presents for follow up Hx of HTN, hyperlipidemia , hypothyroidsim, bipolar disease.  [name] has done well .  No episodes of CP ,  Dyspnea.  February 03, 2016:  Doing well.  Just got back from [place].   No CP or dyspnea.  Had some leg swelling bilaterally  Wore some compression hose which helped.  Her primary MD added HCTZ and decreased metoprolol to 1/2     Past Medical History  Diagnosis Date  . Hypertension   . Hypothyroidism   . Bipolar 1 disorder (HCC)   . Chest pain   . Leg edema     Past Surgical History  Procedure Laterality Date  . Breast biopsy    . [place] echocardiography  09/23/2004    EF 55-60%  . Cardiovascular stress test  07/27/2010    EF 74%. NORMAL  . Wisdom tooth extraction       Current Outpatient Prescriptions  Medication Sig Dispense Refill  . buPROPion (WELLBUTRIN XL) 150 MG 24 hr tablet Take 150 mg by mouth daily.      . CRESTOR 20 MG tablet TAKE 1 TABLET BY MOUTH EVERY NIGHT AT BEDTIME 30 tablet 11  . cyclobenzaprine (FLEXERIL) 5 MG tablet Take 5 mg by mouth every 8 (eight) hours as needed. FOR MUSCLE SPASMS  0  . divalproex (DEPAKOTE) 500 MG DR tablet Take by mouth. Taking 6 Pills QID    . hydrochlorothiazide (HYDRODIURIL) 25 MG tablet Take 25 mg by mouth daily.  1  . LATUDA 20 MG TABS Take 20 mg by mouth daily.  1  . levothyroxine (SYNTHROID, LEVOTHROID) 100 MCG tablet Take 100 mcg by mouth daily.      Marland Kitchen. LORazepam (ATIVAN) 0.5 MG tablet Take 0.5 mg by mouth every 8 (eight) hours as needed for sleep.     . metoprolol succinate (TOPROL-XL) 25 MG 24 hr tablet Take 1 tablet (25 mg total) by mouth daily. (Patient  taking differently: Take 12.5 mg by mouth daily. ) 90 tablet 3  . temazepam (RESTORIL) 30 MG capsule Take 30 mg by mouth at bedtime as needed for sleep.     . traMADol (ULTRAM) 50 MG tablet Take 50 mg by mouth every 12 (twelve) hours as needed.  0   No current facility-administered medications for this visit.    Allergies:   Penicillins; Sulfonamide derivatives; and Limonene    Social History:  The patient  reports that she quit smoking about 34 years ago. She does not have any smokeless tobacco history on file. She reports that she does not drink alcohol or use illicit drugs.   Family History:  The patient's family history includes Breast cancer in her mother and sister; Hypertension in her mother.    ROS:  Please see the history of present illness.   Otherwise, review of systems are positive for none.   All other systems are reviewed and negative.    PHYSICAL EXAM: VS:  BP 120/86 mmHg  Pulse 80  Ht 5' 6.5" (1.689 m)  Wt 172 lb 12.8 oz (78.382 kg)  BMI  27.48 kg/m2 , BMI Body mass index is 27.48 kg/(m^2). GEN: Well nourished, well developed, in no acute distress HEENT: normal Neck: no JVD, carotid bruits, or masses Cardiac: RRR; no murmurs, rubs, or gallops,no edema  Respiratory:  clear to auscultation bilaterally, normal work of breathing GI: soft, nontender, nondistended, + BS MS: no deformity or atrophy Skin: warm and dry, no rash Neuro:  Strength and sensation are intact Psych: euthymic mood, full affect   EKG:  EKG is ordered today. The ekg ordered today demonstrates NSR at 80, Non specific ST abn - no changes from previous ECGs     Recent Labs: No results found for requested labs within last 365 days.    Lipid Panel    Component Value Date/Time   CHOL 153 11/25/2013 1037   TRIG 118.0 11/25/2013 1037   HDL 63.60 11/25/2013 1037   CHOLHDL 2 11/25/2013 1037   VLDL 23.6 11/25/2013 1037   LDLCALC 66 11/25/2013 1037   LDLDIRECT 135.5 10/02/2012 0936      Wt  Readings from Last 3 Encounters:  02/03/16 172 lb 12.8 oz (78.382 kg)  11/25/14 156 lb 3.2 oz (70.852 kg)  11/25/13 159 lb (72.122 kg)      Other studies Reviewed: Additional studies/ records that were reviewed today include: . Review of the above records demonstrates:    ASSESSMENT AND PLAN:  1.  HTN:  BP is well controlled.   2. Hyperlipidemia.  She has gained some weight.   Is going to work on a weight loss program  conitnue meds.   Will see her in 1 year.   Current medicines are reviewed at length with the patient today.  The patient does not have concerns regarding medicines.  The following changes have been made:  no change  Labs/ tests ordered today include:  No orders of the defined types were placed in this encounter.     Disposition:   FU with me  in 1 year    Signed, Nahser, Deloris Ping, MD  02/03/2016 4:31 PM    [facility] [address] [place], [place], [place]  [address] Phone: [phone]; Fax: [phone]

## 2016-02-04 ENCOUNTER — Encounter: Payer: Self-pay | Admitting: Cardiovascular Disease

## 2016-02-07 DIAGNOSIS — M5030 Other cervical disc degeneration, unspecified cervical region: Secondary | ICD-10-CM | POA: Diagnosis not present

## 2016-02-07 DIAGNOSIS — R030 Elevated blood-pressure reading, without diagnosis of hypertension: Secondary | ICD-10-CM | POA: Diagnosis not present

## 2016-02-07 DIAGNOSIS — Z1389 Encounter for screening for other disorder: Secondary | ICD-10-CM | POA: Diagnosis not present

## 2016-02-07 DIAGNOSIS — M810 Age-related osteoporosis without current pathological fracture: Secondary | ICD-10-CM | POA: Diagnosis not present

## 2016-02-07 DIAGNOSIS — E038 Other specified hypothyroidism: Secondary | ICD-10-CM | POA: Diagnosis not present

## 2016-02-07 DIAGNOSIS — F3176 Bipolar disorder, in full remission, most recent episode depressed: Secondary | ICD-10-CM | POA: Diagnosis not present

## 2016-02-07 DIAGNOSIS — Z0000 Encounter for general adult medical examination without abnormal findings: Secondary | ICD-10-CM | POA: Diagnosis not present

## 2016-02-07 DIAGNOSIS — R609 Edema, unspecified: Secondary | ICD-10-CM | POA: Diagnosis not present

## 2016-02-16 ENCOUNTER — Other Ambulatory Visit: Payer: Self-pay | Admitting: *Deleted

## 2016-02-16 MED ORDER — METOPROLOL SUCCINATE ER 25 MG PO TB24: 12.5000 mg | ORAL_TABLET | Freq: Every day | ORAL | Status: DC

## 2016-02-16 NOTE — Telephone Encounter (Signed)
Expand All Collapse All      Cardiology Office Note   Date: 02/03/2016   ID: Kristina Tran, DOB 05/25/1956, MRN [id number]  PCP: Gaspar Garbeichard W Tisovec, MD Cardiologist: Elease HashimotoNahser, Deloris PingPhilip J, MD   Chief Complaint  Patient presents with  . Follow-up    LEG SWELLING, NO CHEST PAIN OR SOB     History of Present Illness: Kristina Tran is a 60 y.o. female who presents for follow up Hx of HTN, hyperlipidemia , hypothyroidsim, bipolar disease.  Kristina Tran has done well . No episodes of CP , Dyspnea.  February 03, 2016:  Doing well.  Just got back from [place]. No CP or dyspnea.  Had some leg swelling bilaterally  Wore some compression hose which helped.  Her primary MD added HCTZ and decreased metoprolol to 1/2        WILL CHANGE ON MED LIST AND REORDER AS REQUESTED.

## 2016-03-01 ENCOUNTER — Telehealth: Payer: Self-pay | Admitting: Cardiovascular Disease

## 2016-03-01 MED ORDER — ROSUVASTATIN CALCIUM 20 MG PO TABS: 20.0000 mg | ORAL_TABLET | Freq: Every day | ORAL | Status: DC

## 2016-03-01 NOTE — Telephone Encounter (Signed)
New message   Pt c/o medication issue:  1. Name of Medication: crestor 20 mg   2. How are you currently taking this medication (dosage and times per day)? At night   3. Are you having a reaction (difficulty breathing--STAT)? No   4. What is your medication issue? Wants genteric brand

## 2016-03-01 NOTE — Telephone Encounter (Signed)
Generic Crestor 20 mg daily called in per patient request. Patient was grateful for call.

## 2016-03-17 DIAGNOSIS — N9411 Superficial (introital) dyspareunia: Secondary | ICD-10-CM | POA: Diagnosis not present

## 2016-03-17 DIAGNOSIS — Z1389 Encounter for screening for other disorder: Secondary | ICD-10-CM | POA: Diagnosis not present

## 2016-03-17 DIAGNOSIS — Z01419 Encounter for gynecological examination (general) (routine) without abnormal findings: Secondary | ICD-10-CM | POA: Diagnosis not present

## 2016-03-17 DIAGNOSIS — Z130 Encounter for screening for diseases of the blood and blood-forming organs and certain disorders involving the immune mechanism: Secondary | ICD-10-CM | POA: Diagnosis not present

## 2016-03-17 DIAGNOSIS — Z124 Encounter for screening for malignant neoplasm of cervix: Secondary | ICD-10-CM | POA: Diagnosis not present

## 2016-05-17 DIAGNOSIS — Z5181 Encounter for therapeutic drug level monitoring: Secondary | ICD-10-CM | POA: Diagnosis not present

## 2016-05-17 DIAGNOSIS — F319 Bipolar disorder, unspecified: Secondary | ICD-10-CM | POA: Diagnosis not present

## 2016-05-18 DIAGNOSIS — L821 Other seborrheic keratosis: Secondary | ICD-10-CM | POA: Diagnosis not present

## 2016-05-22 DIAGNOSIS — F3176 Bipolar disorder, in full remission, most recent episode depressed: Secondary | ICD-10-CM | POA: Diagnosis not present

## 2016-05-22 DIAGNOSIS — R922 Inconclusive mammogram: Secondary | ICD-10-CM | POA: Diagnosis not present

## 2016-05-22 DIAGNOSIS — Z803 Family history of malignant neoplasm of breast: Secondary | ICD-10-CM | POA: Diagnosis not present

## 2016-05-22 DIAGNOSIS — Z1239 Encounter for other screening for malignant neoplasm of breast: Secondary | ICD-10-CM | POA: Diagnosis not present

## 2016-05-22 DIAGNOSIS — N63 Unspecified lump in breast: Secondary | ICD-10-CM | POA: Diagnosis not present

## 2016-07-04 NOTE — Progress Notes (Signed)
Cardiology Office Note:    Date:  07/05/2016   ID:  Kristina Tran, DOB 04-09-1956, MRN [id number]  PCP:  Gaspar Garbe, MD  Cardiologist:  Dr. Delane Ginger   Electrophysiologist:  n/a  Referring MD: Gaspar Garbe, MD   Chief Complaint  Patient presents with  . Shortness of Breath    History of Present Illness:    Kristina Tran is a 60 y.o. female with a hx of HTN, HL, hypothyroidism, bipolar disorder. Last seen by Dr. Elease Hashimoto 3/17.  She presents today for further evaluation of dyspnea. She recently hurt her back and had to stop doing a boxing exercise. Dyspnea with exertion seems to have gotten slightly worse over the past one and one-half months. She can typically cough to improve her symptoms. She's had a substernal ache for the past 2 months. This reminds her of previous MSK chest pain years ago. It has been fairly constant and is not related to exertion. She does note some left arm weakness. She injured her neck 20 years ago in a fall on the ice. She denies syncope. She has chronic LE edema. She's had previous right leg vein stripping. She went to [place] 6 months ago. Over the past several months she's noted that her edema in her right leg has gotten worse. She denies any leg pain. She denies orthopnea or PND.  She denies syncope.    Prior CV studies that were reviewed today include:    Nuclear Stress Study 9/11 Normal, EF 74%  Echo 11/05 EF 55-60%, mild diastolic dysfunction, trace MR  Past Medical History:  Diagnosis Date  . Bipolar 1 disorder (HCC)   . Chest pain   . Hypertension   . Hypothyroidism   . Leg edema     Past Surgical History:  Procedure Laterality Date  . BREAST BIOPSY    . CARDIOVASCULAR STRESS TEST  07/27/2010   EF 74%. NORMAL  . US ECHOCARDIOGRAPHY  09/23/2004   EF 55-60%  . WISDOM TOOTH EXTRACTION      Current Medications: Outpatient Medications Prior to Visit  Medication Sig Dispense Refill  . buPROPion (WELLBUTRIN XL) 150 MG  24 hr tablet Take 150 mg by mouth daily.      . cyclobenzaprine (FLEXERIL) 5 MG tablet Take 5 mg by mouth every 8 (eight) hours as needed. FOR MUSCLE SPASMS  0  . divalproex (DEPAKOTE) 500 MG DR tablet Taking 500 MG TABLETS (5 PILLS TOTAL) BY MOUTH ON Tuesday, Thursday, Friday, Sunday AND 6 PILLS BY MOUTH ON Monday, WEDSNESDAY, SATURDAY    . hydrochlorothiazide (HYDRODIURIL) 25 MG tablet Take 25 mg by mouth daily.  1  . LATUDA 20 MG TABS Take 20 mg by mouth daily.  1  . levothyroxine (SYNTHROID, LEVOTHROID) 100 MCG tablet Take 100 mcg by mouth daily.      Marland Kitchen LORazepam (ATIVAN) 0.5 MG tablet Take 0.5 mg by mouth every 8 (eight) hours as needed for sleep.     . metoprolol succinate (TOPROL-XL) 25 MG 24 hr tablet Take 0.5 tablets (12.5 mg total) by mouth daily. 45 tablet 3  . rosuvastatin (CRESTOR) 20 MG tablet Take 1 tablet (20 mg total) by mouth daily. 30 tablet 11  . temazepam (RESTORIL) 30 MG capsule Take 30 mg by mouth at bedtime as needed for sleep.     . traMADol (ULTRAM) 50 MG tablet Take 50 mg by mouth every 12 (twelve) hours as needed for moderate pain or severe pain.   0  No facility-administered medications prior to visit.       Allergies:   Penicillins; Sulfonamide derivatives; and Limonene   Social History   Social History  . Marital status: Married    Spouse name: N/A  . Number of children: N/A  . Years of education: N/A   Social History Main Topics  . Smoking status: Former Smoker    Quit date: 11/06/1981  . Smokeless tobacco: Former Neurosurgeon  . Alcohol use No  . Drug use: No  . Sexual activity: Not Asked   Other Topics Concern  . None   Social History Narrative  . None     Family History:  The patient's family history includes Breast cancer in her mother and sister; Hypertension in her mother.   ROS:   Please see the history of present illness.    Review of Systems  Constitution: Positive for malaise/fatigue and weight gain.  HENT: Positive for hearing loss.     Cardiovascular: Positive for chest pain, dyspnea on exertion and leg swelling.  Hematologic/Lymphatic: Bruises/bleeds easily.  Musculoskeletal: Positive for back pain.       L arm weakness  Psychiatric/Behavioral: Positive for depression.   All other systems reviewed and are negative.   EKGs/Labs/Other Test Reviewed:    EKG:  EKG is  ordered today.  The ekg ordered today demonstrates NSR, HR 88, normal axis, T-wave inversions in V1-V4, slightly more prominent than previous ECG, QTc 425 ms  Recent Labs: No results found for requested labs within last 8760 hours.   Recent Lipid Panel    Component Value Date/Time   CHOL 153 11/25/2013 1037   TRIG 118.0 11/25/2013 1037   HDL 63.60 11/25/2013 1037   CHOLHDL 2 11/25/2013 1037   VLDL 23.6 11/25/2013 1037   LDLCALC 66 11/25/2013 1037   LDLDIRECT 135.5 10/02/2012 0936     Physical Exam:    VS:  BP 122/80   Pulse 88   Ht 5\' 6"  (1.676 m)   Wt 179 lb (81.2 kg)   SpO2 98%   BMI 28.89 kg/m     Wt Readings from Last 3 Encounters:  07/05/16 179 lb (81.2 kg)  02/03/16 172 lb 12.8 oz (78.4 kg)  11/25/14 156 lb 3.2 oz (70.9 kg)     Physical Exam  Constitutional: She is oriented to person, place, and time. She appears well-developed and well-nourished. No distress.  HENT:  Head: Normocephalic and atraumatic.  Eyes: No scleral icterus.  Neck: No JVD present.  Cardiovascular: Normal rate, regular rhythm and normal heart sounds.   No murmur heard. Pulmonary/Chest: Effort normal and breath sounds normal. She has no wheezes. She has no rales.  Abdominal: Soft. There is no tenderness.  Musculoskeletal: She exhibits edema.  Bilateral (R>L) non-pitting edemaa  Neurological: She is alert and oriented to person, place, and time.  Resting hand tremor noted bilaterally  Skin: Skin is warm and dry.  Psychiatric: She has a normal mood and affect.    ASSESSMENT:    1. Shortness of breath   2. Essential hypertension   3.  Hyperlipidemia   4. Left arm weakness    PLAN:    In order of problems listed above:  1. Shortness of breath - She presents today with symptoms of dyspnea with exertion, atypical chest discomfort and left arm weakness over the past several months. This seems to have coincided with decreased activity related to a recent back injury. Although her ECG changes are nonspecific, T-wave inversions anteriorly seem to  be a little bit more prominent. It has been many years since she has been assessed for ischemia. She has also not had an echocardiogram in many years. She does not appear to be volume overloaded on exam. Her chronic LE edema seems to be related to venous insufficiency. She has gained 12 pounds since she stopped exercising.  -  Arrange ETT-Nuclear.  Will try to arrange before she goes on her trip 07/13/16.  -  Arrange Echo  -  BMET, BNP today  2. HTN - BP controlled.   3. HL - LDL in 3/17 was 116.  Continue Crestor.  4. L arm weakness - If her cardiac workup is unremarkable, would rec FU with PCP to evaluate her cervical spine given her prior injury.     Medication Adjustments/Labs and Tests Ordered: Current medicines are reviewed at length with the patient today.  Concerns regarding medicines are outlined above.  Medication changes, Labs and Tests ordered today are outlined in the Patient Instructions noted below. Patient Instructions  Medication Instructions:  Your physician recommends that you continue on your current medications as directed. Please refer to the Current Medication list given to you today. Labwork: 1. TODAY BMET, BNP Testing/Procedures: 1. Your physician has requested that you have an echocardiogram. Echocardiography is a painless test that uses sound waves to create images of your heart. It provides your doctor with information about the size and shape of your heart and how well your heart's chambers and valves are working. This procedure takes approximately one hour.  There are no restrictions for this procedure. 2. Your physician has requested that you have en exercise stress myoview. For further information please visit [email or web address]. Please follow instruction sheet, as given. Follow-Up: DR. Elease HashimotoNAHSER IN 1 MONTH Any Other Special Instructions Will Be Listed Below (If Applicable). If you need a refill on your cardiac medications before your next appointment, please call your pharmacy.  Signed, Tereso NewcomerScott Lisa-Marie Rueger, PA-C  07/05/2016 10:48 AM    [facility] [address], [place], [place]  [address] Phone: [phone]) [phone]; Fax: [phone]

## 2016-07-05 ENCOUNTER — Telehealth (HOSPITAL_COMMUNITY): Payer: Self-pay | Admitting: *Deleted

## 2016-07-05 ENCOUNTER — Encounter: Payer: Self-pay | Admitting: Physician Assistant

## 2016-07-05 ENCOUNTER — Encounter (INDEPENDENT_AMBULATORY_CARE_PROVIDER_SITE_OTHER): Payer: Self-pay

## 2016-07-05 ENCOUNTER — Ambulatory Visit (INDEPENDENT_AMBULATORY_CARE_PROVIDER_SITE_OTHER): Payer: BLUE CROSS/BLUE SHIELD | Admitting: Physician Assistant

## 2016-07-05 VITALS — BP 122/80 | HR 88 | Ht 66.0 in | Wt 179.0 lb

## 2016-07-05 DIAGNOSIS — D1801 Hemangioma of skin and subcutaneous tissue: Secondary | ICD-10-CM | POA: Diagnosis not present

## 2016-07-05 DIAGNOSIS — L814 Other melanin hyperpigmentation: Secondary | ICD-10-CM | POA: Diagnosis not present

## 2016-07-05 DIAGNOSIS — Z85828 Personal history of other malignant neoplasm of skin: Secondary | ICD-10-CM | POA: Diagnosis not present

## 2016-07-05 DIAGNOSIS — I10 Essential (primary) hypertension: Secondary | ICD-10-CM | POA: Diagnosis not present

## 2016-07-05 DIAGNOSIS — E785 Hyperlipidemia, unspecified: Secondary | ICD-10-CM | POA: Diagnosis not present

## 2016-07-05 DIAGNOSIS — R29898 Other symptoms and signs involving the musculoskeletal system: Secondary | ICD-10-CM | POA: Diagnosis not present

## 2016-07-05 DIAGNOSIS — R0602 Shortness of breath: Secondary | ICD-10-CM | POA: Diagnosis not present

## 2016-07-05 DIAGNOSIS — L821 Other seborrheic keratosis: Secondary | ICD-10-CM | POA: Diagnosis not present

## 2016-07-05 DIAGNOSIS — L570 Actinic keratosis: Secondary | ICD-10-CM | POA: Diagnosis not present

## 2016-07-05 LAB — BASIC METABOLIC PANEL WITH GFR
BUN: 30 mg/dL — ABNORMAL HIGH (ref 7–25)
CO2: 27 mmol/L (ref 20–31)
Calcium: 9.7 mg/dL (ref 8.6–10.4)
Chloride: 98 mmol/L (ref 98–110)
Creat: 1.34 mg/dL — ABNORMAL HIGH (ref 0.50–1.05)
Glucose, Bld: 85 mg/dL (ref 65–99)
Potassium: 3.8 mmol/L (ref 3.5–5.3)
Sodium: 140 mmol/L (ref 135–146)

## 2016-07-05 LAB — BRAIN NATRIURETIC PEPTIDE: Brain Natriuretic Peptide: 11.9 pg/mL (ref <100)

## 2016-07-05 NOTE — Patient Instructions (Addendum)
Medication Instructions:  Your physician recommends that you continue on your current medications as directed. Please refer to the Current Medication list given to you today. Labwork: 1. TODAY BMET, BNP Testing/Procedures: 1. Your physician has requested that you have an echocardiogram. Echocardiography is a painless test that uses sound waves to create images of your heart. It provides your doctor with information about the size and shape of your heart and how well your heart's chambers and valves are working. This procedure takes approximately one hour. There are no restrictions for this procedure. 2. Your physician has requested that you have en exercise stress myoview. For further information please visit [email or web address]. Please follow instruction sheet, as given. Follow-Up: DR. Elease HashimotoNAHSER IN 1 MONTH Any Other Special Instructions Will Be Listed Below (If Applicable). If you need a refill on your cardiac medications before your next appointment, please call your pharmacy.

## 2016-07-05 NOTE — Telephone Encounter (Signed)
Left message on voicemail in reference to upcoming appointment scheduled for  07/07/16 Phone number given for a call back so details instructions can be given.  Kristina Tran Jacqueline   

## 2016-07-11 ENCOUNTER — Encounter: Payer: Self-pay | Admitting: Physician Assistant

## 2016-07-11 ENCOUNTER — Ambulatory Visit (HOSPITAL_BASED_OUTPATIENT_CLINIC_OR_DEPARTMENT_OTHER): Payer: BLUE CROSS/BLUE SHIELD

## 2016-07-11 DIAGNOSIS — I10 Essential (primary) hypertension: Secondary | ICD-10-CM | POA: Diagnosis not present

## 2016-07-11 DIAGNOSIS — E785 Hyperlipidemia, unspecified: Secondary | ICD-10-CM | POA: Diagnosis not present

## 2016-07-11 DIAGNOSIS — I340 Nonrheumatic mitral (valve) insufficiency: Secondary | ICD-10-CM | POA: Diagnosis not present

## 2016-07-11 DIAGNOSIS — R0609 Other forms of dyspnea: Secondary | ICD-10-CM | POA: Diagnosis not present

## 2016-07-11 DIAGNOSIS — R0602 Shortness of breath: Secondary | ICD-10-CM

## 2016-07-11 DIAGNOSIS — I119 Hypertensive heart disease without heart failure: Secondary | ICD-10-CM | POA: Diagnosis not present

## 2016-07-11 DIAGNOSIS — R079 Chest pain, unspecified: Secondary | ICD-10-CM | POA: Diagnosis not present

## 2016-07-11 LAB — MYOCARDIAL PERFUSION IMAGING
CHL CUP MPHR: 161 {beats}/min
CHL CUP NUCLEAR SDS: 1
CHL CUP NUCLEAR SSS: 7
CSEPED: 7 min
CSEPEDS: 0 s
CSEPHR: 92 %
CSEPPHR: 148 {beats}/min
Estimated workload: 8.5 METS
LHR: 0.27
LV dias vol: 52 mL (ref 46–106)
LV sys vol: 13 mL
RPE: 18
Rest HR: 80 {beats}/min
SRS: 6
TID: 0.99

## 2016-07-11 MED ORDER — TECHNETIUM TC 99M TETROFOSMIN IV KIT
10.7000 | PACK | Freq: Once | INTRAVENOUS | Status: AC | PRN
  Administered 2016-07-11: 11 via INTRAVENOUS
  Filled 2016-07-11: qty 11

## 2016-07-11 MED ORDER — TECHNETIUM TC 99M TETROFOSMIN IV KIT
31.6000 | PACK | Freq: Once | INTRAVENOUS | Status: AC | PRN
  Administered 2016-07-11: 32 via INTRAVENOUS
  Filled 2016-07-11: qty 32

## 2016-07-12 ENCOUNTER — Ambulatory Visit (HOSPITAL_COMMUNITY)
Admission: RE | Admit: 2016-07-12 | Discharge: 2016-07-12 | Disposition: A | Discharge status: Home / Self Care | Payer: BLUE CROSS/BLUE SHIELD | Source: Ambulatory Visit | Attending: Physician Assistant | Admitting: Physician Assistant

## 2016-07-12 DIAGNOSIS — R0609 Other forms of dyspnea: Secondary | ICD-10-CM | POA: Insufficient documentation

## 2016-07-12 DIAGNOSIS — I10 Essential (primary) hypertension: Secondary | ICD-10-CM

## 2016-07-12 DIAGNOSIS — I340 Nonrheumatic mitral (valve) insufficiency: Secondary | ICD-10-CM | POA: Insufficient documentation

## 2016-07-12 DIAGNOSIS — R0602 Shortness of breath: Secondary | ICD-10-CM

## 2016-07-12 DIAGNOSIS — E785 Hyperlipidemia, unspecified: Secondary | ICD-10-CM | POA: Insufficient documentation

## 2016-07-12 DIAGNOSIS — I119 Hypertensive heart disease without heart failure: Secondary | ICD-10-CM | POA: Insufficient documentation

## 2016-07-12 DIAGNOSIS — R079 Chest pain, unspecified: Secondary | ICD-10-CM | POA: Diagnosis not present

## 2016-07-12 NOTE — Progress Notes (Signed)
  Echocardiogram 2D Echocardiogram has been performed.  Arvil ChacoFoster, Chakia Counts 07/12/2016, 3:53 PM

## 2016-07-13 ENCOUNTER — Encounter: Payer: Self-pay | Admitting: Physician Assistant

## 2016-07-13 ENCOUNTER — Telehealth: Payer: Self-pay | Admitting: Cardiovascular Disease

## 2016-07-13 NOTE — Telephone Encounter (Signed)
New Message  Pt voiced she calling to see about results from Echo and Stress Test from yesterday.  Please follow up with pt. Thanks!

## 2016-07-13 NOTE — Telephone Encounter (Signed)
New message ° ° ° ° ° ° °Pt returning nurse call  °

## 2016-07-13 NOTE — Telephone Encounter (Signed)
Results of echocardiogram and nuclear stress test reviewed with patient who verbalized understanding.  She thanked me for the call and said she had a great visit with Tereso NewcomerScott Weaver, PA.

## 2016-08-01 NOTE — Progress Notes (Signed)
Cardiology Office Note:    Date:  08/02/2016   ID:  Kristina Tran, DOB 04/25/1956, MRN [id number]  PCP:  Gaspar Garbeichard W Tisovec, MD  Cardiologist:  Dr. Delane GingerPhil Nahser   Electrophysiologist:  n/a  Referring MD: Gaspar Garbeisovec, Richard W, MD   Chief Complaint  Patient presents with  . Follow-up    SOB   History of Present Illness:    Kristina Tran is a 60 y.o. female with a hx of HTN, HL, hypothyroidism, bipolar disorder.   I saw her a few weeks ago with complaints of dyspnea on exertion and some atypical chest pain.  I had her get a BNP which was normal.  She had an Echo which demonstrated normal LVF, mild diastolic dysfunction. Nuclear stress test was negative for ischemia.  She returns for FU.  She went to [place] a [place]. She just got back last weekend. She walked several miles a day.  She still notes dyspnea on exertion but with more mod to extreme activities.  She denies chest pain but can "feel" her chest.  It is hard to describe.  She denies syncope.  She denies orthopnea, PND.  Leg edema is chronic and stable.   Prior CV studies that were reviewed today include:    Echo 07/12/16 Mild LVH, EF 60-65, no RWMA, Gr 1 DD, trivial MR, normal RVSF, mild RAE  Myoview 07/11/16 Normal stress nuclear study with no ischemia or infarction; EF 75 with normal wall motion.   Nuclear Stress Study 9/11 Normal, EF 74%  Echo 11/05 EF 55-60%, mild diastolic dysfunction, trace MR  Past Medical History:  Diagnosis Date  . Bipolar 1 disorder (HCC)   . Chest pain   . History of echocardiogram    a. Echo 9/17: Mild LVH, EF 60-65, no RWMA, Gr 1 DD, trivial MR, normal RVSF, mild RAE  . History of nuclear stress test    a. Nuclear stress test 9/17: EF 75%, no ischemia or scar, normal  . Hypertension   . Hypothyroidism   . Leg edema     Past Surgical History:  Procedure Laterality Date  . BREAST BIOPSY    . CARDIOVASCULAR STRESS TEST  07/27/2010   EF 74%. NORMAL  . US ECHOCARDIOGRAPHY   09/23/2004   EF 55-60%  . WISDOM TOOTH EXTRACTION      Current Medications: Current Meds  Medication Sig  . buPROPion (WELLBUTRIN XL) 150 MG 24 hr tablet Take 150 mg by mouth daily.    . cyclobenzaprine (FLEXERIL) 5 MG tablet Take 5 mg by mouth every 8 (eight) hours as needed. FOR MUSCLE SPASMS  . divalproex (DEPAKOTE) 500 MG DR tablet Taking 500 MG TABLETS (5 PILLS TOTAL) BY MOUTH ON Tuesday, Thursday, Friday, Sunday AND 6 PILLS BY MOUTH ON Monday, WEDSNESDAY, SATURDAY  . hydrochlorothiazide (HYDRODIURIL) 25 MG tablet Take 25 mg by mouth daily.  Marland Kitchen. LATUDA 20 MG TABS Take 20 mg by mouth daily.  Marland Kitchen. levothyroxine (SYNTHROID, LEVOTHROID) 100 MCG tablet Take 100 mcg by mouth daily.    Marland Kitchen. LORazepam (ATIVAN) 0.5 MG tablet Take 0.5 mg by mouth every 8 (eight) hours as needed for sleep.   . metoprolol succinate (TOPROL-XL) 25 MG 24 hr tablet Take 0.5 tablets (12.5 mg total) by mouth daily.  . rosuvastatin (CRESTOR) 20 MG tablet Take 1 tablet (20 mg total) by mouth daily.  . temazepam (RESTORIL) 30 MG capsule Take 30 mg by mouth at bedtime as needed for sleep.   . traMADol Janean Sark(ULTRAM)  50 MG tablet Take 50 mg by mouth every 12 (twelve) hours as needed for moderate pain or severe pain.        Allergies:   Penicillins; Sulfonamide derivatives; and Limonene   Social History   Social History  . Marital status: Married    Spouse name: N/A  . Number of children: N/A  . Years of education: N/A   Social History Main Topics  . Smoking status: Former Smoker    Quit date: 11/06/1981  . Smokeless tobacco: Former Neurosurgeon  . Alcohol use No  . Drug use: No  . Sexual activity: Not Asked   Other Topics Concern  . None   Social History Narrative  . None     Family History:  The patient's family history includes Breast cancer in her mother and sister; Hypertension in her mother.   ROS:   Please see the history of present illness.    ROS All other systems reviewed and are negative.   EKGs/Labs/Other Test  Reviewed:    EKG:  EKG is not ordered today.  The ekg ordered today demonstrates n/a  Recent Labs: 07/05/2016: Brain Natriuretic Peptide 11.9; BUN 30; Creat 1.34; Potassium 3.8; Sodium 140   Recent Lipid Panel    Component Value Date/Time   CHOL 153 11/25/2013 1037   TRIG 118.0 11/25/2013 1037   HDL 63.60 11/25/2013 1037   CHOLHDL 2 11/25/2013 1037   VLDL 23.6 11/25/2013 1037   LDLCALC 66 11/25/2013 1037   LDLDIRECT 135.5 10/02/2012 0936     Physical Exam:    VS:  BP 110/70   Pulse 85   Ht 5\' 6"  (1.676 m)   Wt 184 lb 1.9 oz (83.5 kg)   BMI 29.72 kg/m     Wt Readings from Last 3 Encounters:  08/02/16 184 lb 1.9 oz (83.5 kg)  07/11/16 179 lb (81.2 kg)  07/05/16 179 lb (81.2 kg)     Physical Exam  Constitutional: She is oriented to person, place, and time. She appears well-developed and well-nourished.  HENT:  Head: Normocephalic and atraumatic.  Eyes: No scleral icterus.  Neck: No JVD present.  Cardiovascular: Normal rate, regular rhythm and normal heart sounds.   No murmur heard. Pulmonary/Chest: Effort normal. She has no wheezes. She has no rales.  Abdominal: Soft. There is no tenderness.  Musculoskeletal: She exhibits edema.  R>L non-pitting edema  Neurological: She is alert and oriented to person, place, and time.  Skin: Skin is warm and dry.  Psychiatric: She has a normal mood and affect.    ASSESSMENT:    1. Shortness of breath   2. Essential hypertension   3. Hyperlipidemia   4. Hoarseness of voice    PLAN:    In order of problems listed above:  1. Shortness of breath -  She has had increased shortness of breath over the last few months.  BNP was normal and her Echo showed normal LVF.  Myoview was neg for ischemia.  She did have mild diastolic dysfunction on her Echo.  But, she is not volume overloaded.  She had to stop her usual exercise routine several mos ago due to a back injury and she has gained a considerable amount of weight.  I suspect her  dyspnea is all related to deconditioning and weight gain.  Continue with exercise, diet and weight loss.  If she has worsening symptoms or recurrent chest pain, she will need sooner FU here.    2. HTN - BP controlled.  3. HL - LDL in 3/17 was 116.  Continue Crestor.  4. Hoarse voice - She has noted her voice is "gravely."  I have suggested she FU with her PCP if this continues.  Of note, TSH was normal in 3/17.      Medication Adjustments/Labs and Tests Ordered: Current medicines are reviewed at length with the patient today.  Concerns regarding medicines are outlined above.  Medication changes, Labs and Tests ordered today are outlined in the Patient Instructions noted below. Patient Instructions  Medication Instructions:  Your physician recommends that you continue on your current medications as directed. Please refer to the Current Medication list given to you today.  Labwork: NONE  Testing/Procedures: NONE  Follow-Up: Your physician wants you to follow-up in: 6 MONTHS DR. Arletta Bale will receive a reminder letter in the mail two months in advance. If you don't receive a letter, please call our office to schedule the follow-up appointment.  Any Other Special Instructions Will Be Listed Below (If Applicable). CALL YOUR PRIMARY CARE FOR A FOLLOW UP APPT.   If you need a refill on your cardiac medications before your next appointment, please call your pharmacy.  Signed, Tereso Newcomer, PA-C  08/02/2016 11:08 AM    [facility] [address] [place], [place], [place]  [address] Phone: [phone]; Fax: [phone]

## 2016-08-02 ENCOUNTER — Ambulatory Visit (INDEPENDENT_AMBULATORY_CARE_PROVIDER_SITE_OTHER): Payer: BLUE CROSS/BLUE SHIELD | Admitting: Physician Assistant

## 2016-08-02 ENCOUNTER — Encounter: Payer: Self-pay | Admitting: Physician Assistant

## 2016-08-02 VITALS — BP 110/70 | HR 85 | Ht 66.0 in | Wt 184.1 lb

## 2016-08-02 DIAGNOSIS — R0602 Shortness of breath: Secondary | ICD-10-CM

## 2016-08-02 DIAGNOSIS — R490 Dysphonia: Secondary | ICD-10-CM | POA: Diagnosis not present

## 2016-08-02 DIAGNOSIS — E785 Hyperlipidemia, unspecified: Secondary | ICD-10-CM | POA: Diagnosis not present

## 2016-08-02 DIAGNOSIS — I10 Essential (primary) hypertension: Secondary | ICD-10-CM | POA: Diagnosis not present

## 2016-08-02 NOTE — Patient Instructions (Addendum)
Medication Instructions:  Your physician recommends that you continue on your current medications as directed. Please refer to the Current Medication list given to you today.  Labwork: NONE  Testing/Procedures: NONE  Follow-Up: Your physician wants you to follow-up in: 6 MONTHS DR. Arletta BaleNASHER You will receive a reminder letter in the mail two months in advance. If you don't receive a letter, please call our office to schedule the follow-up appointment.  Any Other Special Instructions Will Be Listed Below (If Applicable). CALL YOUR PRIMARY CARE FOR A FOLLOW UP APPT.   If you need a refill on your cardiac medications before your next appointment, please call your pharmacy.

## 2016-08-17 DIAGNOSIS — Z23 Encounter for immunization: Secondary | ICD-10-CM | POA: Diagnosis not present

## 2016-08-17 DIAGNOSIS — E038 Other specified hypothyroidism: Secondary | ICD-10-CM | POA: Diagnosis not present

## 2016-08-17 DIAGNOSIS — R413 Other amnesia: Secondary | ICD-10-CM | POA: Diagnosis not present

## 2016-08-17 DIAGNOSIS — M5030 Other cervical disc degeneration, unspecified cervical region: Secondary | ICD-10-CM | POA: Diagnosis not present

## 2016-08-17 DIAGNOSIS — R030 Elevated blood-pressure reading, without diagnosis of hypertension: Secondary | ICD-10-CM | POA: Diagnosis not present

## 2016-08-17 DIAGNOSIS — E7800 Pure hypercholesterolemia, unspecified: Secondary | ICD-10-CM | POA: Diagnosis not present

## 2016-08-22 DIAGNOSIS — F319 Bipolar disorder, unspecified: Secondary | ICD-10-CM | POA: Diagnosis not present

## 2016-09-01 DIAGNOSIS — F458 Other somatoform disorders: Secondary | ICD-10-CM | POA: Diagnosis not present

## 2016-09-01 DIAGNOSIS — F3176 Bipolar disorder, in full remission, most recent episode depressed: Secondary | ICD-10-CM | POA: Diagnosis not present

## 2016-10-09 DIAGNOSIS — H43813 Vitreous degeneration, bilateral: Secondary | ICD-10-CM | POA: Diagnosis not present

## 2016-10-09 DIAGNOSIS — Z961 Presence of intraocular lens: Secondary | ICD-10-CM | POA: Diagnosis not present

## 2016-11-02 DIAGNOSIS — Z1231 Encounter for screening mammogram for malignant neoplasm of breast: Secondary | ICD-10-CM | POA: Diagnosis not present

## 2016-11-02 DIAGNOSIS — Z803 Family history of malignant neoplasm of breast: Secondary | ICD-10-CM | POA: Diagnosis not present

## 2016-11-02 DIAGNOSIS — R922 Inconclusive mammogram: Secondary | ICD-10-CM | POA: Diagnosis not present

## 2016-11-09 DIAGNOSIS — Z1231 Encounter for screening mammogram for malignant neoplasm of breast: Secondary | ICD-10-CM | POA: Diagnosis not present

## 2016-11-09 DIAGNOSIS — Z803 Family history of malignant neoplasm of breast: Secondary | ICD-10-CM | POA: Diagnosis not present

## 2016-11-09 DIAGNOSIS — R922 Inconclusive mammogram: Secondary | ICD-10-CM | POA: Diagnosis not present

## 2016-11-10 DIAGNOSIS — L821 Other seborrheic keratosis: Secondary | ICD-10-CM | POA: Diagnosis not present

## 2016-11-10 DIAGNOSIS — L570 Actinic keratosis: Secondary | ICD-10-CM | POA: Diagnosis not present

## 2016-11-10 DIAGNOSIS — D1801 Hemangioma of skin and subcutaneous tissue: Secondary | ICD-10-CM | POA: Diagnosis not present

## 2016-12-08 DIAGNOSIS — F319 Bipolar disorder, unspecified: Secondary | ICD-10-CM | POA: Diagnosis not present

## 2016-12-12 DIAGNOSIS — H43813 Vitreous degeneration, bilateral: Secondary | ICD-10-CM | POA: Diagnosis not present

## 2016-12-12 DIAGNOSIS — Z961 Presence of intraocular lens: Secondary | ICD-10-CM | POA: Diagnosis not present

## 2016-12-19 DIAGNOSIS — F3176 Bipolar disorder, in full remission, most recent episode depressed: Secondary | ICD-10-CM | POA: Diagnosis not present

## 2016-12-21 DIAGNOSIS — G250 Essential tremor: Secondary | ICD-10-CM | POA: Diagnosis not present

## 2017-01-26 ENCOUNTER — Ambulatory Visit: Payer: BLUE CROSS/BLUE SHIELD | Admitting: Cardiovascular Disease

## 2017-01-29 ENCOUNTER — Encounter: Payer: Self-pay | Admitting: Cardiovascular Disease

## 2017-01-29 ENCOUNTER — Ambulatory Visit (INDEPENDENT_AMBULATORY_CARE_PROVIDER_SITE_OTHER): Payer: BLUE CROSS/BLUE SHIELD | Admitting: Cardiovascular Disease

## 2017-01-29 ENCOUNTER — Encounter (INDEPENDENT_AMBULATORY_CARE_PROVIDER_SITE_OTHER): Payer: Self-pay

## 2017-01-29 VITALS — BP 98/70 | HR 81 | Ht 66.0 in | Wt 190.8 lb

## 2017-01-29 DIAGNOSIS — E782 Mixed hyperlipidemia: Secondary | ICD-10-CM | POA: Diagnosis not present

## 2017-01-29 DIAGNOSIS — I10 Essential (primary) hypertension: Secondary | ICD-10-CM

## 2017-01-29 NOTE — Patient Instructions (Signed)
Medication Instructions:  Your physician recommends that you continue on your current medications as directed. Please refer to the Current Medication list given to you today.  Call Marcelino DusterMichelle, RN if you have low blood pressure and/or dizziness or light-headedness  Labwork: None Ordered   Testing/Procedures: None Ordered   Follow-Up: Your physician wants you to follow-up in: 1 year with Dr. Elease HashimotoNahser.  You will receive a reminder letter in the mail two months in advance. If you don't receive a letter, please call our office to schedule the follow-up appointment.   If you need a refill on your cardiac medications before your next appointment, please call your pharmacy.   Thank you for choosing [facility]! Eligha BridegroomMichelle Swinyer, RN [phone]

## 2017-01-29 NOTE — Progress Notes (Signed)
Cardiology Office Note   Date:  01/29/2017   ID:  Kristina Tran, DOB 09/20/1956, MRN [id number]  PCP:  Kristina Garbeichard W Tisovec, MD  Cardiologist:   Kristina MissPhilip Nahser, MD   Chief Complaint  Patient presents with  . Hypertension      History of Present Illness: Kristina Tran is a 61 y.o. female who presents for follow up Hx of HTN, hyperlipidemia , hypothyroidsim, bipolar disease.  Kristina Tran has done well .  No episodes of CP ,  Dyspnea.  February 03, 2016:  Doing well.  Just got back from [place].   No CP or dyspnea.  Had some leg swelling bilaterally  Wore some compression hose which helped.  Her primary MD added HCTZ and decreased metoprolol to 1/2   January 29, 2017  Kristina Tran is doing well .  No CP or dyspnea.  No dizziness.  Not exercising much  Just got back form [place] recently .    Past Medical History:  Diagnosis Date  . Bipolar 1 disorder (HCC)   . Chest pain   . History of echocardiogram    a. Echo 9/17: Mild LVH, EF 60-65, no RWMA, Gr 1 DD, trivial MR, normal RVSF, mild RAE  . History of nuclear stress test    a. Nuclear stress test 9/17: EF 75%, no ischemia or scar, normal  . Hypertension   . Hypothyroidism   . Leg edema     Past Surgical History:  Procedure Laterality Date  . BREAST BIOPSY    . CARDIOVASCULAR STRESS TEST  07/27/2010   EF 74%. NORMAL  . US ECHOCARDIOGRAPHY  09/23/2004   EF 55-60%  . WISDOM TOOTH EXTRACTION       Current Outpatient Prescriptions  Medication Sig Dispense Refill  . buPROPion (WELLBUTRIN XL) 150 MG 24 hr tablet Take 150 mg by mouth daily.      . cyclobenzaprine (FLEXERIL) 5 MG tablet Take 5 mg by mouth every 8 (eight) hours as needed. FOR MUSCLE SPASMS  0  . divalproex (DEPAKOTE) 500 MG DR tablet Taking 500 MG TABLETS (5 PILLS TOTAL) BY MOUTH ON Tuesday, Thursday, Friday, Sunday AND 6 PILLS BY MOUTH ON Monday, WEDSNESDAY, SATURDAY    . hydrochlorothiazide (HYDRODIURIL) 25 MG tablet Take 25 mg by mouth daily.  1  . LATUDA 20  MG TABS Take 20 mg by mouth daily.  1  . levothyroxine (SYNTHROID, LEVOTHROID) 100 MCG tablet Take 100 mcg by mouth daily.      Marland Kitchen. LORazepam (ATIVAN) 0.5 MG tablet Take 0.5 mg by mouth every 8 (eight) hours as needed for sleep.     . metoprolol succinate (TOPROL-XL) 25 MG 24 hr tablet Take 0.5 tablets (12.5 mg total) by mouth daily. 45 tablet 3  . propranolol (INDERAL) 40 MG tablet Take 0.5 tablets by mouth 2 (two) times daily.    . rosuvastatin (CRESTOR) 20 MG tablet Take 1 tablet (20 mg total) by mouth daily. 30 tablet 11  . temazepam (RESTORIL) 30 MG capsule Take 30 mg by mouth at bedtime as needed for sleep.     . traMADol (ULTRAM) 50 MG tablet Take 50 mg by mouth every 12 (twelve) hours as needed for moderate pain or severe pain.   0   No current facility-administered medications for this visit.     Allergies:   Penicillins; Sulfonamide derivatives; and Limonene    Social History:  The patient  reports that she quit smoking about 35 years ago. She has quit using smokeless  tobacco. She reports that she does not drink alcohol or use drugs.   Family History:  The patient's family history includes Breast cancer in her mother and sister; Hypertension in her mother.    ROS:  Please see the history of present illness.   Otherwise, review of systems are positive for none.   All other systems are reviewed and negative.    PHYSICAL EXAM: VS:  BP 98/70 (BP Location: Right Arm, Patient Position: Sitting, Cuff Size: Normal)   Pulse 81   Ht 5\' 6"  (1.676 m)   Wt 190 lb 12.8 oz (86.5 kg)   SpO2 96%   BMI 30.80 kg/m  , BMI Body mass index is 30.8 kg/m. GEN: Well nourished, well developed, in no acute distress  HEENT: normal  Neck: no JVD, carotid bruits, or masses Cardiac: RRR; no murmurs, rubs, or gallops,no edema  Respiratory:  clear to auscultation bilaterally, normal work of breathing GI: soft, nontender, nondistended, + BS MS: no deformity or atrophy  Skin: warm and dry, no  rash Neuro:  Strength and sensation are intact Psych: euthymic mood, full affect   EKG:  EKG is ordered today. The ekg ordered today demonstrates NSR at 80, Non specific ST abn - no changes from previous ECGs     Recent Labs: 07/05/2016: Brain Natriuretic Peptide 11.9; BUN 30; Creat 1.34; Potassium 3.8; Sodium 140    Lipid Panel    Component Value Date/Time   CHOL 153 11/25/2013 1037   TRIG 118.0 11/25/2013 1037   HDL 63.60 11/25/2013 1037   CHOLHDL 2 11/25/2013 1037   VLDL 23.6 11/25/2013 1037   LDLCALC 66 11/25/2013 1037   LDLDIRECT 135.5 10/02/2012 0936      Wt Readings from Last 3 Encounters:  01/29/17 190 lb 12.8 oz (86.5 kg)  08/02/16 184 lb 1.9 oz (83.5 kg)  07/11/16 179 lb (81.2 kg)      Other studies Reviewed: Additional studies/ records that were reviewed today include: . Review of the above records demonstrates:    ASSESSMENT AND PLAN:  1.  HTN:  BP is well controlled.   2. Hyperlipidemia.  She has gained some weight.   Is going to work on a weight loss program  conitnue meds.   Will see her in 1 year.   Current medicines are reviewed at length with the patient today.  The patient does not have concerns regarding medicines.  The following changes have been made:  no change  Labs/ tests ordered today include:  No orders of the defined types were placed in this encounter.  Disposition:   FU with me  in 1 year   Signed, Kristina Miss, MD  01/29/2017 11:32 AM    [facility] [address] [place], [place], [place]  [address] Phone: [phone]; Fax: [phone]

## 2017-02-13 DIAGNOSIS — E039 Hypothyroidism, unspecified: Secondary | ICD-10-CM | POA: Diagnosis not present

## 2017-02-13 DIAGNOSIS — M810 Age-related osteoporosis without current pathological fracture: Secondary | ICD-10-CM | POA: Diagnosis not present

## 2017-02-13 DIAGNOSIS — F3189 Other bipolar disorder: Secondary | ICD-10-CM | POA: Diagnosis not present

## 2017-02-13 DIAGNOSIS — E7800 Pure hypercholesterolemia, unspecified: Secondary | ICD-10-CM | POA: Diagnosis not present

## 2017-02-16 DIAGNOSIS — Z1212 Encounter for screening for malignant neoplasm of rectum: Secondary | ICD-10-CM | POA: Diagnosis not present

## 2017-02-20 DIAGNOSIS — M5030 Other cervical disc degeneration, unspecified cervical region: Secondary | ICD-10-CM | POA: Diagnosis not present

## 2017-02-20 DIAGNOSIS — Z0000 Encounter for general adult medical examination without abnormal findings: Secondary | ICD-10-CM | POA: Diagnosis not present

## 2017-02-20 DIAGNOSIS — Z1389 Encounter for screening for other disorder: Secondary | ICD-10-CM | POA: Diagnosis not present

## 2017-02-20 DIAGNOSIS — R413 Other amnesia: Secondary | ICD-10-CM | POA: Diagnosis not present

## 2017-02-20 DIAGNOSIS — R030 Elevated blood-pressure reading, without diagnosis of hypertension: Secondary | ICD-10-CM | POA: Diagnosis not present

## 2017-02-20 DIAGNOSIS — K648 Other hemorrhoids: Secondary | ICD-10-CM | POA: Diagnosis not present

## 2017-03-07 ENCOUNTER — Other Ambulatory Visit: Payer: Self-pay | Admitting: Cardiovascular Disease

## 2017-03-16 DIAGNOSIS — G250 Essential tremor: Secondary | ICD-10-CM | POA: Diagnosis not present

## 2017-03-16 DIAGNOSIS — F3176 Bipolar disorder, in full remission, most recent episode depressed: Secondary | ICD-10-CM | POA: Diagnosis not present

## 2017-03-16 DIAGNOSIS — F458 Other somatoform disorders: Secondary | ICD-10-CM | POA: Diagnosis not present

## 2017-04-11 DIAGNOSIS — Z124 Encounter for screening for malignant neoplasm of cervix: Secondary | ICD-10-CM | POA: Diagnosis not present

## 2017-04-11 DIAGNOSIS — Z130 Encounter for screening for diseases of the blood and blood-forming organs and certain disorders involving the immune mechanism: Secondary | ICD-10-CM | POA: Diagnosis not present

## 2017-04-11 DIAGNOSIS — Z01419 Encounter for gynecological examination (general) (routine) without abnormal findings: Secondary | ICD-10-CM | POA: Diagnosis not present

## 2017-04-11 DIAGNOSIS — Z1389 Encounter for screening for other disorder: Secondary | ICD-10-CM | POA: Diagnosis not present

## 2017-04-11 DIAGNOSIS — Z6829 Body mass index (BMI) 29.0-29.9, adult: Secondary | ICD-10-CM | POA: Diagnosis not present

## 2017-04-13 ENCOUNTER — Other Ambulatory Visit (INDEPENDENT_AMBULATORY_CARE_PROVIDER_SITE_OTHER): Payer: Self-pay | Admitting: Orthopaedic Surgery

## 2017-04-13 NOTE — Telephone Encounter (Signed)
Ok for refill? 

## 2017-04-15 NOTE — Telephone Encounter (Signed)
I have known her for years, we take care of her husband etc.  I do not think I prescribed this for her. Call pharmacy and see who was the doc who did and let me know. thanks

## 2017-04-16 NOTE — Telephone Encounter (Signed)
Ok , let her know we can send in # 30 . She uses then sparingly. Same sig as before thank you .

## 2017-04-16 NOTE — Telephone Encounter (Signed)
It was prescribed by you and called by me on 07/12/2016.

## 2017-04-17 MED ORDER — TRAMADOL HCL 50 MG PO TABS: 50.0000 mg | ORAL_TABLET | Freq: Two times a day (BID) | ORAL | 0 refills | Status: DC | PRN

## 2017-04-17 NOTE — Telephone Encounter (Signed)
Called to pharmacy 

## 2017-04-17 NOTE — Telephone Encounter (Signed)
I left voicemail for patient advising. 

## 2017-04-17 NOTE — Addendum Note (Signed)
Addended by: Rogers SeedsYEATTS, Giordan Fordham M on: 04/17/2017 02:42 PM   Modules accepted: Orders

## 2017-04-27 DIAGNOSIS — F3176 Bipolar disorder, in full remission, most recent episode depressed: Secondary | ICD-10-CM | POA: Diagnosis not present

## 2017-04-27 DIAGNOSIS — G250 Essential tremor: Secondary | ICD-10-CM | POA: Diagnosis not present

## 2017-05-16 DIAGNOSIS — Z803 Family history of malignant neoplasm of breast: Secondary | ICD-10-CM | POA: Diagnosis not present

## 2017-05-16 DIAGNOSIS — Z1231 Encounter for screening mammogram for malignant neoplasm of breast: Secondary | ICD-10-CM | POA: Diagnosis not present

## 2017-05-16 DIAGNOSIS — R922 Inconclusive mammogram: Secondary | ICD-10-CM | POA: Diagnosis not present

## 2017-05-31 DIAGNOSIS — D1801 Hemangioma of skin and subcutaneous tissue: Secondary | ICD-10-CM | POA: Diagnosis not present

## 2017-05-31 DIAGNOSIS — Z85828 Personal history of other malignant neoplasm of skin: Secondary | ICD-10-CM | POA: Diagnosis not present

## 2017-05-31 DIAGNOSIS — L814 Other melanin hyperpigmentation: Secondary | ICD-10-CM | POA: Diagnosis not present

## 2017-05-31 DIAGNOSIS — L821 Other seborrheic keratosis: Secondary | ICD-10-CM | POA: Diagnosis not present

## 2017-06-20 DIAGNOSIS — G250 Essential tremor: Secondary | ICD-10-CM | POA: Diagnosis not present

## 2017-08-15 ENCOUNTER — Other Ambulatory Visit (INDEPENDENT_AMBULATORY_CARE_PROVIDER_SITE_OTHER): Payer: Self-pay | Admitting: Orthopaedic Surgery

## 2017-08-15 NOTE — Telephone Encounter (Signed)
Ok refill thanks 

## 2017-08-15 NOTE — Telephone Encounter (Signed)
Ok for refill? 

## 2017-08-30 DIAGNOSIS — E038 Other specified hypothyroidism: Secondary | ICD-10-CM | POA: Diagnosis not present

## 2017-08-30 DIAGNOSIS — Z23 Encounter for immunization: Secondary | ICD-10-CM | POA: Diagnosis not present

## 2017-08-30 DIAGNOSIS — M810 Age-related osteoporosis without current pathological fracture: Secondary | ICD-10-CM | POA: Diagnosis not present

## 2017-08-30 DIAGNOSIS — R030 Elevated blood-pressure reading, without diagnosis of hypertension: Secondary | ICD-10-CM | POA: Diagnosis not present

## 2017-08-30 DIAGNOSIS — E7800 Pure hypercholesterolemia, unspecified: Secondary | ICD-10-CM | POA: Diagnosis not present

## 2017-08-30 DIAGNOSIS — F3189 Other bipolar disorder: Secondary | ICD-10-CM | POA: Diagnosis not present

## 2017-08-31 DIAGNOSIS — F3176 Bipolar disorder, in full remission, most recent episode depressed: Secondary | ICD-10-CM | POA: Diagnosis not present

## 2017-08-31 DIAGNOSIS — G250 Essential tremor: Secondary | ICD-10-CM | POA: Diagnosis not present

## 2017-09-17 DIAGNOSIS — R05 Cough: Secondary | ICD-10-CM | POA: Diagnosis not present

## 2017-09-17 DIAGNOSIS — J189 Pneumonia, unspecified organism: Secondary | ICD-10-CM | POA: Diagnosis not present

## 2017-09-17 DIAGNOSIS — Z6828 Body mass index (BMI) 28.0-28.9, adult: Secondary | ICD-10-CM | POA: Diagnosis not present

## 2017-09-19 DIAGNOSIS — G5602 Carpal tunnel syndrome, left upper limb: Secondary | ICD-10-CM | POA: Diagnosis not present

## 2017-09-19 DIAGNOSIS — Z6828 Body mass index (BMI) 28.0-28.9, adult: Secondary | ICD-10-CM | POA: Diagnosis not present

## 2017-10-10 DIAGNOSIS — M7660 Achilles tendinitis, unspecified leg: Secondary | ICD-10-CM | POA: Diagnosis not present

## 2017-10-10 DIAGNOSIS — Z6829 Body mass index (BMI) 29.0-29.9, adult: Secondary | ICD-10-CM | POA: Diagnosis not present

## 2017-10-19 DIAGNOSIS — Z86010 Personal history of colonic polyps: Secondary | ICD-10-CM | POA: Diagnosis not present

## 2017-10-22 ENCOUNTER — Ambulatory Visit (INDEPENDENT_AMBULATORY_CARE_PROVIDER_SITE_OTHER): Payer: BLUE CROSS/BLUE SHIELD | Admitting: Orthopedic Surgery

## 2017-10-22 ENCOUNTER — Encounter (INDEPENDENT_AMBULATORY_CARE_PROVIDER_SITE_OTHER): Payer: Self-pay | Admitting: Orthopedic Surgery

## 2017-10-22 DIAGNOSIS — M6702 Short Achilles tendon (acquired), left ankle: Secondary | ICD-10-CM | POA: Diagnosis not present

## 2017-10-22 DIAGNOSIS — M6701 Short Achilles tendon (acquired), right ankle: Secondary | ICD-10-CM

## 2017-10-22 DIAGNOSIS — F3189 Other bipolar disorder: Secondary | ICD-10-CM | POA: Diagnosis not present

## 2017-10-22 NOTE — Progress Notes (Signed)
Office Visit Note   Patient: Kristina Tran           Date of Birth: 09/16/1956           MRN: [id number] Visit Date: 10/22/2017              Requested by: Willis Modenarinkard, Sue, NP [address] [place], [place] [address] PCP: Gaspar Garbeisovec, Richard W, MD  Chief Complaint  Patient presents with  . Right Ankle - Pain  . Left Ankle - Pain      HPI: Patient is a 61 year old woman who presents complaining of insertional Achilles tendon pain bilaterally.  She states that right before Thanksgiving she played golf for 3days and had increasing Achilles tendon insertional pain.  She states she can only go up 1 step at a time.  She states she has had slight improvement over time previously has worn orthotics.  She states she would like to go back to golf and golf this March.  Assessment & Plan: Visit Diagnoses:  1. Achilles tendon contracture, bilateral     Plan: Patient was given instruction and demonstrated heel cord stretching to do 5 times a day a minute at a time she was given 2 felt heel lift pads to wear in her shoes she states she felt immediate relief after wearing the pads.  Discussed that we could call in a prescription for nitroglycerin patches if the stretching and heel lift do not work.  Recommend that she could try CBD lotion at the area of the insertional Achilles tendinitis.  Follow-Up Instructions: Return if symptoms worsen or fail to improve.   Ortho Exam  Patient is alert, oriented, no adenopathy, well-dressed, normal affect, normal respiratory effort. Examination patient has a good dorsalis pedis pulse bilaterally.  Patient does have heel cord contracture bilaterally with dorsiflexion just short of neutral with her knee extended.  She is tenderness to palpation at the insertion of the Achilles bilaterally her posterior tibial tendon is nontender to palpation.  There are no nodular changes.  No palpable defects.  Imaging: No results found. No images are attached to the  encounter.  Labs: No results found for: HGBA1C, ESRSEDRATE, CRP, LABURIC, REPTSTATUS, GRAMSTAIN, CULT, LABORGA  @LABSALLVALUES (HGBA1)@  There is no height or weight on file to calculate BMI.  Orders:  No orders of the defined types were placed in this encounter.  No orders of the defined types were placed in this encounter.    Procedures: No procedures performed  Clinical Data: No additional findings.  ROS:  All other systems negative, except as noted in the HPI. Review of Systems  Objective: Vital Signs: There were no vitals taken for this visit.  Specialty Comments:  No specialty comments available.  PMFS History: Patient Active Problem List   Diagnosis Date Noted  . Hyperlipidemia 11/25/2013  . Bipolar affective disorder (HCC) 10/02/2012  . HTN (hypertension) 09/22/2011  . Hypothyroidism 09/22/2011   Past Medical History:  Diagnosis Date  . Bipolar 1 disorder (HCC)   . Chest pain   . History of echocardiogram    a. Echo 9/17: Mild LVH, EF 60-65, no RWMA, Gr 1 DD, trivial MR, normal RVSF, mild RAE  . History of nuclear stress test    a. Nuclear stress test 9/17: EF 75%, no ischemia or scar, normal  . Hypertension   . Hypothyroidism   . Leg edema     Family History  Problem Relation Age of Onset  . Breast cancer Mother   . Hypertension  Mother   . Breast cancer Sister     Past Surgical History:  Procedure Laterality Date  . BREAST BIOPSY    . CARDIOVASCULAR STRESS TEST  07/27/2010   EF 74%. NORMAL  . US ECHOCARDIOGRAPHY  09/23/2004   EF 55-60%  . WISDOM TOOTH EXTRACTION     Social History   Occupational History  . Not on file  Tobacco Use  . Smoking status: Former Smoker    Last attempt to quit: 11/06/1981    Years since quitting: 35.9  . Smokeless tobacco: Former Engineer, waterUser  Substance and Sexual Activity  . Alcohol use: No  . Drug use: No  . Sexual activity: Not on file

## 2017-11-15 DIAGNOSIS — Z1231 Encounter for screening mammogram for malignant neoplasm of breast: Secondary | ICD-10-CM | POA: Diagnosis not present

## 2017-11-15 DIAGNOSIS — Z803 Family history of malignant neoplasm of breast: Secondary | ICD-10-CM | POA: Diagnosis not present

## 2017-11-15 DIAGNOSIS — R922 Inconclusive mammogram: Secondary | ICD-10-CM | POA: Diagnosis not present

## 2017-11-15 DIAGNOSIS — Z9189 Other specified personal risk factors, not elsewhere classified: Secondary | ICD-10-CM | POA: Diagnosis not present

## 2017-12-09 ENCOUNTER — Other Ambulatory Visit (INDEPENDENT_AMBULATORY_CARE_PROVIDER_SITE_OTHER): Payer: Self-pay | Admitting: Orthopaedic Surgery

## 2017-12-10 NOTE — Telephone Encounter (Signed)
Ok for refill? 

## 2017-12-11 NOTE — Telephone Encounter (Signed)
Ok refill thanks 

## 2017-12-12 NOTE — Telephone Encounter (Signed)
Called to pharmacy 

## 2017-12-24 DIAGNOSIS — D126 Benign neoplasm of colon, unspecified: Secondary | ICD-10-CM | POA: Diagnosis not present

## 2017-12-24 DIAGNOSIS — Z86010 Personal history of colonic polyps: Secondary | ICD-10-CM | POA: Diagnosis not present

## 2017-12-24 DIAGNOSIS — Z8371 Family history of colonic polyps: Secondary | ICD-10-CM | POA: Diagnosis not present

## 2017-12-26 DIAGNOSIS — D126 Benign neoplasm of colon, unspecified: Secondary | ICD-10-CM | POA: Diagnosis not present

## 2018-01-11 ENCOUNTER — Other Ambulatory Visit: Payer: Self-pay | Admitting: Cardiovascular Disease

## 2018-01-21 DIAGNOSIS — F3189 Other bipolar disorder: Secondary | ICD-10-CM | POA: Diagnosis not present

## 2018-01-23 DIAGNOSIS — Z6830 Body mass index (BMI) 30.0-30.9, adult: Secondary | ICD-10-CM | POA: Diagnosis not present

## 2018-01-23 DIAGNOSIS — R05 Cough: Secondary | ICD-10-CM | POA: Diagnosis not present

## 2018-01-29 DIAGNOSIS — L570 Actinic keratosis: Secondary | ICD-10-CM | POA: Diagnosis not present

## 2018-01-29 DIAGNOSIS — D1801 Hemangioma of skin and subcutaneous tissue: Secondary | ICD-10-CM | POA: Diagnosis not present

## 2018-01-29 DIAGNOSIS — L853 Xerosis cutis: Secondary | ICD-10-CM | POA: Diagnosis not present

## 2018-01-29 DIAGNOSIS — B078 Other viral warts: Secondary | ICD-10-CM | POA: Diagnosis not present

## 2018-01-29 DIAGNOSIS — Z85828 Personal history of other malignant neoplasm of skin: Secondary | ICD-10-CM | POA: Diagnosis not present

## 2018-01-29 DIAGNOSIS — L814 Other melanin hyperpigmentation: Secondary | ICD-10-CM | POA: Diagnosis not present

## 2018-02-20 DIAGNOSIS — M810 Age-related osteoporosis without current pathological fracture: Secondary | ICD-10-CM | POA: Diagnosis not present

## 2018-02-20 DIAGNOSIS — E7800 Pure hypercholesterolemia, unspecified: Secondary | ICD-10-CM | POA: Diagnosis not present

## 2018-02-20 DIAGNOSIS — E038 Other specified hypothyroidism: Secondary | ICD-10-CM | POA: Diagnosis not present

## 2018-02-20 DIAGNOSIS — R82998 Other abnormal findings in urine: Secondary | ICD-10-CM | POA: Diagnosis not present

## 2018-02-26 DIAGNOSIS — F3176 Bipolar disorder, in full remission, most recent episode depressed: Secondary | ICD-10-CM | POA: Diagnosis not present

## 2018-02-26 DIAGNOSIS — G250 Essential tremor: Secondary | ICD-10-CM | POA: Diagnosis not present

## 2018-02-27 DIAGNOSIS — R030 Elevated blood-pressure reading, without diagnosis of hypertension: Secondary | ICD-10-CM | POA: Diagnosis not present

## 2018-02-27 DIAGNOSIS — M810 Age-related osteoporosis without current pathological fracture: Secondary | ICD-10-CM | POA: Diagnosis not present

## 2018-02-27 DIAGNOSIS — F319 Bipolar disorder, unspecified: Secondary | ICD-10-CM | POA: Diagnosis not present

## 2018-02-27 DIAGNOSIS — Z1389 Encounter for screening for other disorder: Secondary | ICD-10-CM | POA: Diagnosis not present

## 2018-02-27 DIAGNOSIS — Z0000 Encounter for general adult medical examination without abnormal findings: Secondary | ICD-10-CM | POA: Diagnosis not present

## 2018-02-27 DIAGNOSIS — E038 Other specified hypothyroidism: Secondary | ICD-10-CM | POA: Diagnosis not present

## 2018-03-04 ENCOUNTER — Ambulatory Visit (INDEPENDENT_AMBULATORY_CARE_PROVIDER_SITE_OTHER): Payer: BLUE CROSS/BLUE SHIELD | Admitting: Cardiovascular Disease

## 2018-03-04 ENCOUNTER — Encounter: Payer: Self-pay | Admitting: Cardiovascular Disease

## 2018-03-04 VITALS — BP 114/80 | HR 72 | Ht 66.0 in | Wt 191.0 lb

## 2018-03-04 DIAGNOSIS — I10 Essential (primary) hypertension: Secondary | ICD-10-CM | POA: Diagnosis not present

## 2018-03-04 NOTE — Patient Instructions (Addendum)
Medication Instructions:  Your physician recommends that you continue on your current medications as directed. Please refer to the Current Medication list given to you today.   Labwork: None Ordered   Testing/Procedures: None Ordered   Follow-Up: Your physician wants you to follow-up in: 1 year with Dr. Nahser.  You will receive a reminder letter in the mail two months in advance. If you don't receive a letter, please call our office to schedule the follow-up appointment.   If you need a refill on your cardiac medications before your next appointment, please call your pharmacy.   Thank you for choosing [facility]! Michelle Swinyer, RN [phone]     For your  leg edema you  should do  the following 1. Leg elevation - I recommend the Lounge Dr. Leg rest.  See below for details  2. Salt restriction  -  Use potassium chloride instead of regular salt as a salt substitute. 3. Walk regularly 4. Compression hose - [facility] [employer] 5. Weight loss    Available on [email or web address] Or  Go to [email or web address]      

## 2018-03-04 NOTE — Progress Notes (Signed)
Cardiology Office Note   Date:  03/04/2018   ID:  Kristina Tran, DOB 06-Sep-1956, MRN [id number]  PCP:  Gaspar Garbe, MD  Cardiologist:   Kristeen Miss, MD   Chief Complaint  Patient presents with  . Hypertension        Kristina Tran is a 62 y.o. female who presents for follow up Hx of HTN, hyperlipidemia , hypothyroidsim, bipolar disease.  Kristina Tran has done well .  No episodes of CP ,  Dyspnea.  February 03, 2016:  Doing well.  Just got back from [place].   No CP or dyspnea.  Had some leg swelling bilaterally  Wore some compression hose which helped.  Her primary MD added HCTZ and decreased metoprolol to 1/2   January 29, 2017  Kristina Tran is doing well .  No CP or dyspnea.  No dizziness.  Not exercising much  Just got back form [place] recently .   March 04, 2018: Kristina Tran is seen today for follow-up of her hypertension and atypical chest pain.  Has a history of hyperlipidemia. Has bilateral achilies tendonitits Slowly improving Is on Lasix and HCTZ Discussed the Loung Doctor leg rest.  Has gained some weight .  Playing golf  Looking forward to walking more   Past Medical History:  Diagnosis Date  . Bipolar 1 disorder (HCC)   . Chest pain   . History of echocardiogram    a. Echo 9/17: Mild LVH, EF 60-65, no RWMA, Gr 1 DD, trivial MR, normal RVSF, mild RAE  . History of nuclear stress test    a. Nuclear stress test 9/17: EF 75%, no ischemia or scar, normal  . Hypertension   . Hypothyroidism   . Leg edema     Past Surgical History:  Procedure Laterality Date  . BREAST BIOPSY    . CARDIOVASCULAR STRESS TEST  07/27/2010   EF 74%. NORMAL  . US ECHOCARDIOGRAPHY  09/23/2004   EF 55-60%  . WISDOM TOOTH EXTRACTION       Current Outpatient Medications  Medication Sig Dispense Refill  . buPROPion (WELLBUTRIN XL) 150 MG 24 hr tablet Take 150 mg by mouth daily.      . divalproex (DEPAKOTE) 500 MG DR tablet Taking 500 MG TABLETS (5 PILLS TOTAL) BY MOUTH ON  Tuesday, Thursday, Friday, Sunday AND 6 PILLS BY MOUTH ON Monday, WEDSNESDAY, SATURDAY    . furosemide (LASIX) 20 MG tablet Take 1 tablet by mouth daily.    . hydrochlorothiazide (HYDRODIURIL) 25 MG tablet Take 25 mg by mouth daily.  1  . LATUDA 20 MG TABS Take 20 mg by mouth daily.  1  . levothyroxine (SYNTHROID, LEVOTHROID) 100 MCG tablet Take 100 mcg by mouth daily.      Marland Kitchen LORazepam (ATIVAN) 0.5 MG tablet Take 0.5 mg by mouth every 8 (eight) hours as needed for sleep.     . rosuvastatin (CRESTOR) 20 MG tablet Take 1 tablet (20 mg total) by mouth daily. Please keep upcoming appt for future refills. Thank you 30 tablet 1  . temazepam (RESTORIL) 30 MG capsule Take 30 mg by mouth as directed. Pt takes 1 every 6 months    . traMADol (ULTRAM) 50 MG tablet TAKE 1 TABLET BY MOUTH EVERY 12 HOURS AS NEEDED FOR PAIN 30 tablet 0  . cyclobenzaprine (FLEXERIL) 5 MG tablet Take 5 mg by mouth every 8 (eight) hours as needed. FOR MUSCLE SPASMS  0  . propranolol (INDERAL) 40 MG tablet Take 0.5 tablets by  mouth daily.      No current facility-administered medications for this visit.     Allergies:   Penicillins; Sulfonamide derivatives; and Limonene    Social History:  The patient  reports that she quit smoking about 36 years ago. She has quit using smokeless tobacco. She reports that she does not drink alcohol or use drugs.   Family History:  The patient's family history includes Breast cancer in her mother and sister; Hypertension in her mother.    ROS: Noted in current history, all other review of systems are negative.  Physical Exam: Blood pressure 114/80, pulse 72, height  (1.676 m), weight 191 lb (86.6 kg), SpO2 98 %.  GEN:  Well nourished, well developed in no acute distress HEENT: Normal NECK: No JVD; No carotid bruits LYMPHATICS: No lymphadenopathy CARDIAC: RRR  RESPIRATORY:  Clear to auscultation without rales, wheezing or rhonchi  ABDOMEN: Soft, non-tender,  non-distended MUSCULOSKELETAL:   1 + bilateral edema  SKIN: Warm and dry NEUROLOGIC:  Alert and oriented x 3   EKG: March 04, 2018: Normal sinus rhythm at 72.  Nonspecific ST and T wave abnormalities.   Recent Labs: No results found for requested labs within last 8760 hours.    Lipid Panel    Component Value Date/Time   CHOL 153 11/25/2013 1037   TRIG 118.0 11/25/2013 1037   HDL 63.60 11/25/2013 1037   CHOLHDL 2 11/25/2013 1037   VLDL 23.6 11/25/2013 1037   LDLCALC 66 11/25/2013 1037   LDLDIRECT 135.5 10/02/2012 0936      Wt Readings from Last 3 Encounters:  03/04/18 191 lb (86.6 kg)  01/29/17 190 lb 12.8 oz (86.5 kg)  08/02/16 184 lb 1.9 oz (83.5 kg)      Other studies Reviewed: Additional studies/ records that were reviewed today include: . Review of the above records demonstrates:    ASSESSMENT AND PLAN:  1.  HTN:   BP is well controlled.  Renal function is normal   2. Hyperlipidemia.  Plans per primary   3.  Bilateral leg edema: She has bilateral Achilles tendinitis.  She is on Lasix and HCTZ.  I suspect that she will be able to stop the Lasix if she raises her legs on a regular/daily basis.  I given her instructions on how to get a Lounge doctor leg rest. .      Will see her in 1 year.   Current medicines are reviewed at length with the patient today.  The patient does not have concerns regarding medicines.  The following changes have been made:  no change  Labs/ tests ordered today include:  No orders of the defined types were placed in this encounter.     Signed, Kristeen Miss, MD  03/04/2018 3:35 PM    [facility] [address] [place], [place], [place]  [address] Phone: [phone]; Fax: [phone]

## 2018-03-13 ENCOUNTER — Other Ambulatory Visit: Payer: Self-pay | Admitting: Cardiovascular Disease

## 2018-03-24 ENCOUNTER — Other Ambulatory Visit (INDEPENDENT_AMBULATORY_CARE_PROVIDER_SITE_OTHER): Payer: Self-pay | Admitting: Orthopaedic Surgery

## 2018-03-25 NOTE — Telephone Encounter (Signed)
Rx request 

## 2018-03-25 NOTE — Telephone Encounter (Signed)
Ok refill thanks 

## 2018-04-17 DIAGNOSIS — Z1389 Encounter for screening for other disorder: Secondary | ICD-10-CM | POA: Diagnosis not present

## 2018-04-17 DIAGNOSIS — L570 Actinic keratosis: Secondary | ICD-10-CM | POA: Diagnosis not present

## 2018-04-17 DIAGNOSIS — Z124 Encounter for screening for malignant neoplasm of cervix: Secondary | ICD-10-CM | POA: Diagnosis not present

## 2018-04-17 DIAGNOSIS — Z130 Encounter for screening for diseases of the blood and blood-forming organs and certain disorders involving the immune mechanism: Secondary | ICD-10-CM | POA: Diagnosis not present

## 2018-04-17 DIAGNOSIS — Z01419 Encounter for gynecological examination (general) (routine) without abnormal findings: Secondary | ICD-10-CM | POA: Diagnosis not present

## 2018-04-18 DIAGNOSIS — Z124 Encounter for screening for malignant neoplasm of cervix: Secondary | ICD-10-CM | POA: Diagnosis not present

## 2018-05-16 DIAGNOSIS — Z9189 Other specified personal risk factors, not elsewhere classified: Secondary | ICD-10-CM | POA: Diagnosis not present

## 2018-05-16 DIAGNOSIS — Z1239 Encounter for other screening for malignant neoplasm of breast: Secondary | ICD-10-CM | POA: Diagnosis not present

## 2018-05-16 DIAGNOSIS — Z803 Family history of malignant neoplasm of breast: Secondary | ICD-10-CM | POA: Diagnosis not present

## 2018-05-16 DIAGNOSIS — Z1231 Encounter for screening mammogram for malignant neoplasm of breast: Secondary | ICD-10-CM | POA: Diagnosis not present

## 2018-05-22 DIAGNOSIS — Z961 Presence of intraocular lens: Secondary | ICD-10-CM | POA: Diagnosis not present

## 2018-05-22 DIAGNOSIS — H0289 Other specified disorders of eyelid: Secondary | ICD-10-CM | POA: Diagnosis not present

## 2018-05-22 DIAGNOSIS — H43813 Vitreous degeneration, bilateral: Secondary | ICD-10-CM | POA: Diagnosis not present

## 2018-05-31 DIAGNOSIS — F319 Bipolar disorder, unspecified: Secondary | ICD-10-CM | POA: Diagnosis not present

## 2018-06-27 DIAGNOSIS — F319 Bipolar disorder, unspecified: Secondary | ICD-10-CM | POA: Diagnosis not present

## 2018-06-27 DIAGNOSIS — M810 Age-related osteoporosis without current pathological fracture: Secondary | ICD-10-CM | POA: Diagnosis not present

## 2018-07-01 DIAGNOSIS — H903 Sensorineural hearing loss, bilateral: Secondary | ICD-10-CM | POA: Diagnosis not present

## 2018-07-16 ENCOUNTER — Other Ambulatory Visit (INDEPENDENT_AMBULATORY_CARE_PROVIDER_SITE_OTHER): Payer: Self-pay | Admitting: Orthopaedic Surgery

## 2018-07-16 NOTE — Telephone Encounter (Signed)
OK refill thanks 

## 2018-07-16 NOTE — Telephone Encounter (Signed)
Ok for refill? 

## 2018-08-05 DIAGNOSIS — F319 Bipolar disorder, unspecified: Secondary | ICD-10-CM | POA: Diagnosis not present

## 2018-08-14 DIAGNOSIS — Z23 Encounter for immunization: Secondary | ICD-10-CM | POA: Diagnosis not present

## 2018-08-19 DIAGNOSIS — Z23 Encounter for immunization: Secondary | ICD-10-CM | POA: Diagnosis not present

## 2018-08-30 DIAGNOSIS — G250 Essential tremor: Secondary | ICD-10-CM | POA: Diagnosis not present

## 2018-08-30 DIAGNOSIS — F3176 Bipolar disorder, in full remission, most recent episode depressed: Secondary | ICD-10-CM | POA: Diagnosis not present

## 2018-09-04 DIAGNOSIS — Z85828 Personal history of other malignant neoplasm of skin: Secondary | ICD-10-CM | POA: Diagnosis not present

## 2018-09-04 DIAGNOSIS — L821 Other seborrheic keratosis: Secondary | ICD-10-CM | POA: Diagnosis not present

## 2018-09-04 DIAGNOSIS — L814 Other melanin hyperpigmentation: Secondary | ICD-10-CM | POA: Diagnosis not present

## 2018-09-04 DIAGNOSIS — D1801 Hemangioma of skin and subcutaneous tissue: Secondary | ICD-10-CM | POA: Diagnosis not present

## 2018-09-09 DIAGNOSIS — F319 Bipolar disorder, unspecified: Secondary | ICD-10-CM | POA: Diagnosis not present

## 2018-10-07 DIAGNOSIS — N6452 Nipple discharge: Secondary | ICD-10-CM | POA: Diagnosis not present

## 2018-10-09 ENCOUNTER — Other Ambulatory Visit: Payer: Self-pay | Admitting: Obstetrics and Gynecology

## 2018-10-09 DIAGNOSIS — N6452 Nipple discharge: Secondary | ICD-10-CM

## 2018-10-15 DIAGNOSIS — E7800 Pure hypercholesterolemia, unspecified: Secondary | ICD-10-CM | POA: Diagnosis not present

## 2018-10-15 DIAGNOSIS — F319 Bipolar disorder, unspecified: Secondary | ICD-10-CM | POA: Diagnosis not present

## 2018-10-18 ENCOUNTER — Ambulatory Visit
Admission: RE | Admit: 2018-10-18 | Discharge: 2018-10-18 | Disposition: A | Discharge status: Home / Self Care | Payer: BLUE CROSS/BLUE SHIELD | Source: Ambulatory Visit | Attending: Obstetrics and Gynecology | Admitting: Obstetrics and Gynecology

## 2018-10-18 DIAGNOSIS — N6489 Other specified disorders of breast: Secondary | ICD-10-CM | POA: Diagnosis not present

## 2018-10-18 DIAGNOSIS — N6452 Nipple discharge: Secondary | ICD-10-CM

## 2018-10-18 DIAGNOSIS — R922 Inconclusive mammogram: Secondary | ICD-10-CM | POA: Diagnosis not present

## 2018-10-21 ENCOUNTER — Other Ambulatory Visit: Payer: BLUE CROSS/BLUE SHIELD

## 2018-10-21 DIAGNOSIS — N6452 Nipple discharge: Secondary | ICD-10-CM | POA: Diagnosis not present

## 2018-10-22 DIAGNOSIS — N6452 Nipple discharge: Secondary | ICD-10-CM | POA: Diagnosis not present

## 2018-11-08 DIAGNOSIS — N6452 Nipple discharge: Secondary | ICD-10-CM | POA: Diagnosis not present

## 2018-12-06 DIAGNOSIS — G250 Essential tremor: Secondary | ICD-10-CM | POA: Diagnosis not present

## 2018-12-06 DIAGNOSIS — F3176 Bipolar disorder, in full remission, most recent episode depressed: Secondary | ICD-10-CM | POA: Diagnosis not present

## 2018-12-11 DIAGNOSIS — S39012A Strain of muscle, fascia and tendon of lower back, initial encounter: Secondary | ICD-10-CM | POA: Diagnosis not present

## 2018-12-13 ENCOUNTER — Ambulatory Visit (INDEPENDENT_AMBULATORY_CARE_PROVIDER_SITE_OTHER): Payer: BLUE CROSS/BLUE SHIELD

## 2018-12-13 ENCOUNTER — Ambulatory Visit (INDEPENDENT_AMBULATORY_CARE_PROVIDER_SITE_OTHER): Payer: BLUE CROSS/BLUE SHIELD | Admitting: Orthopaedic Surgery

## 2018-12-13 ENCOUNTER — Encounter (INDEPENDENT_AMBULATORY_CARE_PROVIDER_SITE_OTHER): Payer: Self-pay | Admitting: Orthopaedic Surgery

## 2018-12-13 VITALS — BP 119/78 | HR 72 | Ht 66.5 in | Wt 174.0 lb

## 2018-12-13 DIAGNOSIS — M545 Low back pain, unspecified: Secondary | ICD-10-CM

## 2018-12-18 NOTE — Progress Notes (Signed)
Office Visit Note   Patient: Kristina Tran           Date of Birth: 1956/06/05           MRN: [id number] Visit Date: 12/13/2018              Requested by: Gaspar Garbe, MD [address] [place], [place] [address] PCP: Wylene Simmer Adelfa Koh, MD   Assessment & Plan: Visit Diagnoses:  1. Acute left-sided low back pain, unspecified whether sciatica present     Plan: She will continue to work on a walking program continue Aleve.  We discussed core strengthening and poss possible physical therapy referral if she so desired she will call if she like the referral.  Follow-Up Instructions: Return if symptoms worsen or fail to improve.   Orders:  Orders Placed This Encounter  Procedures  . XR Lumbar Spine 2-3 Views   No orders of the defined types were placed in this encounter.     Procedures: No procedures performed   Clinical Data: No additional findings.   Subjective: Chief Complaint  Patient presents with  . Lower Back - Pain    HPI 63 year old female seen with acute onset of back pain and hip pain about 2 weeks ago when she was climbing in and out of duct blinds on a hunting trip.  She had lower abdominal pain.  She saw her GYN physician sometimes the pain radiates to her thighs and mid calf.  Difficulty getting from sitting to standing turning twisting kneeling with overall soreness in the buttocks back and hip region.  Aleve with slight improvement.  Climbing in and out of the duct blinds involved step of 2-1/2 feet which she had to do repetitively.  Review of Systems 14 point systems positive for hypertension bipolar disorder hyperlipidemia hypothyroidism.  Has problems with her knees.  Chondromalacia.   Objective: Vital Signs: BP 119/78   Pulse 72   Ht 5' 6.5" (1.689 m)   Wt 174 lb (78.9 kg)   BMI 27.66 kg/m   Physical Exam Constitutional:      Appearance: She is well-developed.  HENT:     Head: Normocephalic.     Right Ear: External ear  normal.     Left Ear: External ear normal.  Eyes:     Pupils: Pupils are equal, round, and reactive to light.  Neck:     Thyroid: No thyromegaly.     Trachea: No tracheal deviation.  Cardiovascular:     Rate and Rhythm: Normal rate.  Pulmonary:     Effort: Pulmonary effort is normal.  Abdominal:     Palpations: Abdomen is soft.  Skin:    General: Skin is warm and dry.  Neurological:     Mental Status: She is alert and oriented to person, place, and time.  Psychiatric:        Behavior: Behavior normal.     Ortho Exam negative straight leg raising knee and ankle jerk are intact mild sciatic notch tenderness some reproduction of pain with resisted hip flexion hip abduction quads.  Pulses are intact negative straight leg raising 90 degrees.  Specialty Comments:  No specialty comments available.  Imaging: No results found.   PMFS History: Patient Active Problem List   Diagnosis Date Noted  . Hyperlipidemia 11/25/2013  . Bipolar affective disorder (HCC) 10/02/2012  . HTN (hypertension) 09/22/2011  . Hypothyroidism 09/22/2011   Past Medical History:  Diagnosis Date  . Bipolar 1 disorder (HCC)   .  Chest pain   . History of echocardiogram    a. Echo 9/17: Mild LVH, EF 60-65, no RWMA, Gr 1 DD, trivial MR, normal RVSF, mild RAE  . History of nuclear stress test    a. Nuclear stress test 9/17: EF 75%, no ischemia or scar, normal  . Hypertension   . Hypothyroidism   . Leg edema     Family History  Problem Relation Age of Onset  . Breast cancer Mother   . Hypertension Mother   . Breast cancer Sister     Past Surgical History:  Procedure Laterality Date  . BREAST BIOPSY    . BREAST EXCISIONAL BIOPSY Right    2000  . CARDIOVASCULAR STRESS TEST  07/27/2010   EF 74%. NORMAL  . US ECHOCARDIOGRAPHY  09/23/2004   EF 55-60%  . WISDOM TOOTH EXTRACTION     Social History   Occupational History  . Not on file  Tobacco Use  . Smoking status: Former Smoker    Last  attempt to quit: 11/06/1981    Years since quitting: 37.1  . Smokeless tobacco: Former Engineer, waterUser  Substance and Sexual Activity  . Alcohol use: No  . Drug use: No  . Sexual activity: Not on file

## 2019-01-02 DIAGNOSIS — Z1231 Encounter for screening mammogram for malignant neoplasm of breast: Secondary | ICD-10-CM | POA: Diagnosis not present

## 2019-01-02 DIAGNOSIS — Z1239 Encounter for other screening for malignant neoplasm of breast: Secondary | ICD-10-CM | POA: Diagnosis not present

## 2019-01-02 DIAGNOSIS — Z9189 Other specified personal risk factors, not elsewhere classified: Secondary | ICD-10-CM | POA: Diagnosis not present

## 2019-01-02 DIAGNOSIS — Z803 Family history of malignant neoplasm of breast: Secondary | ICD-10-CM | POA: Diagnosis not present

## 2019-01-06 DIAGNOSIS — R102 Pelvic and perineal pain: Secondary | ICD-10-CM | POA: Diagnosis not present

## 2019-01-26 ENCOUNTER — Encounter (HOSPITAL_COMMUNITY): Payer: Self-pay | Admitting: *Deleted

## 2019-01-26 ENCOUNTER — Telehealth: Payer: Self-pay | Admitting: Physician Assistant

## 2019-01-26 ENCOUNTER — Emergency Department (HOSPITAL_COMMUNITY)
Admission: EM | Admit: 2019-01-26 | Discharge: 2019-01-26 | Disposition: A | Discharge status: Home / Self Care | Payer: BLUE CROSS/BLUE SHIELD | Attending: Emergency Medicine | Admitting: Emergency Medicine

## 2019-01-26 ENCOUNTER — Other Ambulatory Visit: Payer: Self-pay

## 2019-01-26 ENCOUNTER — Emergency Department (HOSPITAL_COMMUNITY): Payer: BLUE CROSS/BLUE SHIELD

## 2019-01-26 DIAGNOSIS — Z79899 Other long term (current) drug therapy: Secondary | ICD-10-CM | POA: Insufficient documentation

## 2019-01-26 DIAGNOSIS — R0602 Shortness of breath: Secondary | ICD-10-CM | POA: Diagnosis not present

## 2019-01-26 DIAGNOSIS — E039 Hypothyroidism, unspecified: Secondary | ICD-10-CM | POA: Insufficient documentation

## 2019-01-26 DIAGNOSIS — I10 Essential (primary) hypertension: Secondary | ICD-10-CM | POA: Insufficient documentation

## 2019-01-26 DIAGNOSIS — Z87891 Personal history of nicotine dependence: Secondary | ICD-10-CM | POA: Diagnosis not present

## 2019-01-26 LAB — CBC WITH DIFFERENTIAL/PLATELET
Abs Immature Granulocytes: 0.08 10*3/uL — ABNORMAL HIGH (ref 0.00–0.07)
BASOS PCT: 1 %
Basophils Absolute: 0.1 10*3/uL (ref 0.0–0.1)
Eosinophils Absolute: 0.1 10*3/uL (ref 0.0–0.5)
Eosinophils Relative: 1 %
HCT: 44.5 % (ref 36.0–46.0)
Hemoglobin: 14 g/dL (ref 12.0–15.0)
Immature Granulocytes: 1 %
Lymphocytes Relative: 41 %
Lymphs Abs: 3.8 10*3/uL (ref 0.7–4.0)
MCH: 28.9 pg (ref 26.0–34.0)
MCHC: 31.5 g/dL (ref 30.0–36.0)
MCV: 91.9 fL (ref 80.0–100.0)
Monocytes Absolute: 0.8 10*3/uL (ref 0.1–1.0)
Monocytes Relative: 9 %
Neutro Abs: 4.4 10*3/uL (ref 1.7–7.7)
Neutrophils Relative %: 47 %
Platelets: 258 10*3/uL (ref 150–400)
RBC: 4.84 MIL/uL (ref 3.87–5.11)
RDW: 14.3 % (ref 11.5–15.5)
WBC: 9.2 10*3/uL (ref 4.0–10.5)
nRBC: 0.2 % (ref 0.0–0.2)

## 2019-01-26 LAB — COMPREHENSIVE METABOLIC PANEL
ALT: 17 U/L (ref 0–44)
ANION GAP: 10 (ref 5–15)
AST: 23 U/L (ref 15–41)
Albumin: 3.7 g/dL (ref 3.5–5.0)
Alkaline Phosphatase: 56 U/L (ref 38–126)
BUN: 27 mg/dL — ABNORMAL HIGH (ref 8–23)
CO2: 27 mmol/L (ref 22–32)
Calcium: 9.4 mg/dL (ref 8.9–10.3)
Chloride: 100 mmol/L (ref 98–111)
Creatinine, Ser: 1.23 mg/dL — ABNORMAL HIGH (ref 0.44–1.00)
GFR calc Af Amer: 54 mL/min — ABNORMAL LOW (ref >60)
GFR calc non Af Amer: 47 mL/min — ABNORMAL LOW (ref >60)
Glucose, Bld: 85 mg/dL (ref 70–99)
Potassium: 3.9 mmol/L (ref 3.5–5.1)
Sodium: 137 mmol/L (ref 135–145)
Total Bilirubin: 0.6 mg/dL (ref 0.3–1.2)
Total Protein: 6.6 g/dL (ref 6.5–8.1)

## 2019-01-26 LAB — I-STAT TROPONIN, ED
Troponin i, poc: 0.02 ng/mL (ref 0.00–0.08)
Troponin i, poc: 0 ng/mL (ref 0.00–0.08)

## 2019-01-26 LAB — I-STAT CREATININE, ED: CREATININE: 1.3 mg/dL — AB (ref 0.44–1.00)

## 2019-01-26 MED ORDER — IOPAMIDOL (ISOVUE-370) INJECTION 76%
75.0000 mL | Freq: Once | INTRAVENOUS | Status: AC | PRN
  Administered 2019-01-26: 75 mL via INTRAVENOUS

## 2019-01-26 MED ORDER — IOPAMIDOL (ISOVUE-370) INJECTION 76%
INTRAVENOUS | Status: AC
  Filled 2019-01-26: qty 100

## 2019-01-26 MED ORDER — ALBUTEROL SULFATE HFA 108 (90 BASE) MCG/ACT IN AERS
2.0000 | INHALATION_SPRAY | Freq: Once | RESPIRATORY_TRACT | Status: AC
  Administered 2019-01-26: 2 via RESPIRATORY_TRACT
  Filled 2019-01-26: qty 6.7

## 2019-01-26 MED ORDER — SODIUM CHLORIDE 0.9 % IV BOLUS
1000.0000 mL | Freq: Once | INTRAVENOUS | Status: AC
  Administered 2019-01-26: 1000 mL via INTRAVENOUS

## 2019-01-26 NOTE — ED Notes (Signed)
The pt returned from c-t  Iv fluid hung

## 2019-01-26 NOTE — Telephone Encounter (Signed)
Kristina Tran called the cardiology after hour answering service complaining of chest pain and dyspnea that has been persistent since yesterday afternoon. She noticed more when she climb upstairs. She says the symptom is not intermittent but persistent. She feels she cannot get a deep breath since yesterday. Denies any fever, chill or cough.   Unclear cause, recommend ED workup.

## 2019-01-26 NOTE — ED Notes (Signed)
Going to ct

## 2019-01-26 NOTE — ED Triage Notes (Signed)
The pt has had  Some sob since yesterday  And some chest pressure all day  She took aspirin 650mg  earlier today  Alert oriented skin warm and dry

## 2019-01-26 NOTE — ED Provider Notes (Signed)
[facility] EMERGENCY DEPARTMENT Provider Note   CSN: [id number] Arrival date & time: 01/26/19  1711    History   Chief Complaint Chief Complaint  Patient presents with  . Shortness of Breath    HPI Kristina Tran is a 63 y.o. female history of bipolar, hypertension, hypothyroidism here presenting with shortness of breath.  Patient states that for the last 2 days, she has some shortness of breath when she lays down.  She states that she feels like she cannot catch her breath.  She denies any fevers or chills or cough.  She states that she has some left-sided chest pressure as well.  Patient has no known coronary artery disease.  Of note, patient just drove down to [place] and came back about a week ago.  Patient states that she was in a rodeo with 5000 people but that was 2 weeks ago and she adamantly denies any cough or fever currently.  She states that she has significant family history of breast cancer but has no known cancer history herself.      The history is provided by the patient.    Past Medical History:  Diagnosis Date  . Bipolar 1 disorder (HCC)   . Chest pain   . History of echocardiogram    a. Echo 9/17: Mild LVH, EF 60-65, no RWMA, Gr 1 DD, trivial MR, normal RVSF, mild RAE  . History of nuclear stress test    a. Nuclear stress test 9/17: EF 75%, no ischemia or scar, normal  . Hypertension   . Hypothyroidism   . Leg edema     Patient Active Problem List   Diagnosis Date Noted  . Hyperlipidemia 11/25/2013  . Bipolar affective disorder (HCC) 10/02/2012  . HTN (hypertension) 09/22/2011  . Hypothyroidism 09/22/2011    Past Surgical History:  Procedure Laterality Date  . BREAST BIOPSY    . BREAST EXCISIONAL BIOPSY Right    2000  . CARDIOVASCULAR STRESS TEST  07/27/2010   EF 74%. NORMAL  . US ECHOCARDIOGRAPHY  09/23/2004   EF 55-60%  . WISDOM TOOTH EXTRACTION       OB History   No obstetric history on file.      Home  Medications    Prior to Admission medications   Medication Sig Start Date End Date Taking? Authorizing Provider  ARIPiprazole (ABILIFY) 5 MG tablet  11/26/18   [provider]  buPROPion (WELLBUTRIN XL) 150 MG 24 hr tablet Take 150 mg by mouth daily.      [provider]  cyclobenzaprine (FLEXERIL) 5 MG tablet Take 5 mg by mouth every 8 (eight) hours as needed. FOR MUSCLE SPASMS 11/05/14   [provider]  divalproex (DEPAKOTE) 500 MG DR tablet Taking 500 MG TABLETS (5 PILLS TOTAL) BY MOUTH ON Tuesday, Thursday, Friday, Sunday AND 6 PILLS BY MOUTH ON Monday, [facility], SATURDAY    [provider]  furosemide (LASIX) 20 MG tablet Take 1 tablet by mouth daily. 02/25/18   [provider]  hydrochlorothiazide (HYDRODIURIL) 25 MG tablet Take 25 mg by mouth daily. 01/28/16   [provider]  LATUDA 20 MG TABS Take 20 mg by mouth daily. 10/20/14   [provider]  levothyroxine (SYNTHROID, LEVOTHROID) 100 MCG tablet Take 100 mcg by mouth daily.      [provider]  LORazepam (ATIVAN) 0.5 MG tablet Take 0.5 mg by mouth every 8 (eight) hours as needed for sleep.     [provider]  propranolol (INDERAL) 40 MG tablet Take 0.5 tablets by mouth daily.  12/21/16 12/21/17  [provider]  rosuvastatin (CRESTOR) 20 MG tablet TAKE 1 TABLET BY MOUTH DAILY 03/13/18   Nahser, Deloris Ping, MD  temazepam (RESTORIL) 30 MG capsule Take 30 mg by mouth as directed. Pt takes 1 every 6 months    [provider]  traMADol (ULTRAM) 50 MG tablet TAKE 1 TABLET BY MOUTH EVERY 12 HOURS AS NEEDED FOR PAIN Patient not taking: Reported on 12/13/2018 07/16/18   Eldred Manges, MD    Family History Family History  Problem Relation Age of Onset  . Breast cancer Mother   . Hypertension Mother   . Breast cancer Sister     Social History Social History   Tobacco Use  . Smoking status: Former Smoker    Last attempt to quit: 11/06/1981     Years since quitting: 37.2  . Smokeless tobacco: Former Engineer, water Use Topics  . Alcohol use: No  . Drug use: No     Allergies   Penicillins; Sulfonamide derivatives; and Limonene   Review of Systems Review of Systems  Respiratory: Positive for shortness of breath.   All other systems reviewed and are negative.    Physical Exam Updated Vital Signs BP (!) 158/97 (BP Location: Right Arm)   Pulse 71   Temp 97.8 F (36.6 C) (Oral)   Resp (!) 28   Ht 5\' 6"  (1.676 m)   Wt 79.4 kg   SpO2 97%   BMI 28.25 kg/m   Physical Exam Vitals signs and nursing note reviewed.  HENT:     Head: Normocephalic.     Mouth/Throat:     Mouth: Mucous membranes are moist.  Eyes:     Pupils: Pupils are equal, round, and reactive to light.  Neck:     Musculoskeletal: Normal range of motion and neck supple.  Cardiovascular:     Rate and Rhythm: Normal rate and regular rhythm.  Pulmonary:     Comments: Slightly tachypneic, no wheezing or crackles  Abdominal:     General: Bowel sounds are normal.     Palpations: Abdomen is soft.  Musculoskeletal: Normal range of motion.  Skin:    General: Skin is warm.     Capillary Refill: Capillary refill takes less than 2 seconds.  Neurological:     General: No focal deficit present.     Mental Status: She is alert and oriented to person, place, and time.  Psychiatric:        Mood and Affect: Mood normal.        Behavior: Behavior normal.      ED Treatments / Results  Labs (all labs ordered are listed, but only abnormal results are displayed) Labs Reviewed  I-STAT CREATININE, ED - Abnormal; Notable for the following components:      Result Value   Creatinine, Ser 1.30 (*)    All other components within normal limits  CBC WITH DIFFERENTIAL/PLATELET  COMPREHENSIVE METABOLIC PANEL  I-STAT TROPONIN, ED    EKG EKG Interpretation  Date/Time:  Sunday January 26 2019 17:18:41 EDT Ventricular Rate:  71 PR Interval:    QRS Duration: 93 QT  Interval:  445 QTC Calculation: 484 R Axis:   92 Text Interpretation:  Sinus rhythm Right axis deviation No previous ECGs available Confirmed by Richardean Canal [phone]) on 01/26/2019 5:42:38 PM   Radiology No results found.  Procedures Procedures (including critical care time)  Medications Ordered  in ED Medications  sodium chloride 0.9 % bolus 1,000 mL (has no administration in time range)     Initial Impression / Assessment and Plan / ED Course  I have reviewed the triage vital signs and the nursing notes.  Pertinent labs & imaging results that were available during my care of the patient were reviewed by me and considered in my medical decision making (see chart for details).       Kristina Tran is a 63 y.o. female here with SOB. Had recent travel to [place] so concerned about PE. Afebrile, not tachycardic or hypoxic. She was in a rodeo 2 weeks ago but has no fever or cough to suggest viral syndrome (or COVID in particular). Will get labs, CTA chest.   9:31 PM Labs unremarkable. Trop neg x 2. CTA negative. Not sure why she has SOB at night. Consider OSA vs mild bronchitis. Will give albuterol prn. Stable for discharge.   Final Clinical Impressions(s) / ED Diagnoses   Final diagnoses:  None    ED Discharge Orders    None       Charlynne Pander, MD 01/26/19 2131

## 2019-01-26 NOTE — Discharge Instructions (Signed)
Use albuterol every 6 hrs as needed for shortness of breath.   See your doctor  Return to ER if you have worse shortness of breath, fever, chest pain

## 2019-01-29 DIAGNOSIS — R0789 Other chest pain: Secondary | ICD-10-CM | POA: Diagnosis not present

## 2019-01-29 DIAGNOSIS — J392 Other diseases of pharynx: Secondary | ICD-10-CM | POA: Diagnosis not present

## 2019-01-29 DIAGNOSIS — R0609 Other forms of dyspnea: Secondary | ICD-10-CM | POA: Diagnosis not present

## 2019-02-26 DIAGNOSIS — R82998 Other abnormal findings in urine: Secondary | ICD-10-CM | POA: Diagnosis not present

## 2019-02-26 DIAGNOSIS — E038 Other specified hypothyroidism: Secondary | ICD-10-CM | POA: Diagnosis not present

## 2019-02-26 DIAGNOSIS — E7800 Pure hypercholesterolemia, unspecified: Secondary | ICD-10-CM | POA: Diagnosis not present

## 2019-02-26 DIAGNOSIS — Z0000 Encounter for general adult medical examination without abnormal findings: Secondary | ICD-10-CM | POA: Diagnosis not present

## 2019-02-26 DIAGNOSIS — Z5181 Encounter for therapeutic drug level monitoring: Secondary | ICD-10-CM | POA: Diagnosis not present

## 2019-02-26 DIAGNOSIS — M810 Age-related osteoporosis without current pathological fracture: Secondary | ICD-10-CM | POA: Diagnosis not present

## 2019-02-27 ENCOUNTER — Other Ambulatory Visit: Payer: Self-pay | Admitting: Cardiovascular Disease

## 2019-03-05 DIAGNOSIS — Z5181 Encounter for therapeutic drug level monitoring: Secondary | ICD-10-CM | POA: Diagnosis not present

## 2019-03-05 DIAGNOSIS — E039 Hypothyroidism, unspecified: Secondary | ICD-10-CM | POA: Diagnosis not present

## 2019-03-05 DIAGNOSIS — Z0000 Encounter for general adult medical examination without abnormal findings: Secondary | ICD-10-CM | POA: Diagnosis not present

## 2019-03-05 DIAGNOSIS — Z1331 Encounter for screening for depression: Secondary | ICD-10-CM | POA: Diagnosis not present

## 2019-03-05 DIAGNOSIS — M810 Age-related osteoporosis without current pathological fracture: Secondary | ICD-10-CM | POA: Diagnosis not present

## 2019-03-05 DIAGNOSIS — F319 Bipolar disorder, unspecified: Secondary | ICD-10-CM | POA: Diagnosis not present

## 2019-03-06 ENCOUNTER — Telehealth: Payer: Self-pay

## 2019-03-06 NOTE — Telephone Encounter (Signed)
Left message for pt to call back about switching appt to virtual or phone visit.

## 2019-03-07 DIAGNOSIS — G250 Essential tremor: Secondary | ICD-10-CM | POA: Diagnosis not present

## 2019-03-07 DIAGNOSIS — F3176 Bipolar disorder, in full remission, most recent episode depressed: Secondary | ICD-10-CM | POA: Diagnosis not present

## 2019-03-11 ENCOUNTER — Telehealth: Payer: Self-pay | Admitting: Cardiovascular Disease

## 2019-03-11 NOTE — Telephone Encounter (Signed)
Spoke with patient who confirmed all demographics. Patient has a smart phone. She elected not to sign up for My Chart. Will have vitals ready for visit.    Virtual Visit Pre-Appointment Phone Call  "(Name), I am calling you today to discuss your upcoming appointment. We are currently trying to limit exposure to the virus that causes COVID-19 by seeing patients at home rather than in the office."  1. "What is the BEST phone number to call the day of the visit?" - include this in appointment notes  2. Do you have or have access to (through a family member/friend) a smartphone with video capability that we can use for your visit?" a. If yes - list this number in appt notes as cell (if different from BEST phone #) and list the appointment type as a VIDEO visit in appointment notes b. If no - list the appointment type as a PHONE visit in appointment notes  3. Confirm consent - "In the setting of the current Covid19 crisis, you are scheduled for a (phone or video) visit with your provider on (date) at (time).  Just as we do with many in-office visits, in order for you to participate in this visit, we must obtain consent.  If you'd like, I can send this to your mychart (if signed up) or email for you to review.  Otherwise, I can obtain your verbal consent now.  All virtual visits are billed to your insurance company just like a normal visit would be.  By agreeing to a virtual visit, we'd like you to understand that the technology does not allow for your provider to perform an examination, and thus may limit your provider's ability to fully assess your condition. If your provider identifies any concerns that need to be evaluated in person, we will make arrangements to do so.  Finally, though the technology is pretty good, we cannot assure that it will always work on either your or our end, and in the setting of a video visit, we may have to convert it to a phone-only visit.  In either situation, we cannot  ensure that we have a secure connection.  Are you willing to proceed?" Patient said "yes".  4. Advise patient to be prepared - "Two hours prior to your appointment, go ahead and check your blood pressure, pulse, oxygen saturation, and your weight (if you have the equipment to check those) and write them all down. When your visit starts, your provider will ask you for this information. If you have an Apple Watch or Kardia device, please plan to have heart rate information ready on the day of your appointment. Please have a pen and paper handy nearby the day of the visit as well."  5. Give patient instructions for MyChart download to smartphone OR Doximity/Doxy.me as below if video visit (depending on what platform provider is using)  6. Inform patient they will receive a phone call 15 minutes prior to their appointment time (may be from unknown caller ID) so they should be prepared to answer    TELEPHONE CALL NOTE  Jaiyla Belson has been deemed a candidate for a follow-up tele-health visit to limit community exposure during the Covid-19 pandemic. I spoke with the patient via phone to ensure availability of phone/video source, confirm preferred email & phone number, and discuss instructions and expectations.  I reminded Shawndel Senesac to be prepared with any vital sign and/or heart rhythm information that could potentially be obtained via home monitoring, at  the time of her visit. I reminded Joesph JulySarah Gorrell Langsam to expect a phone call prior to her visit.  Royann ShiversLarrie L Walters 03/11/2019 10:56 AM   INSTRUCTIONS FOR DOWNLOADING THE MYCHART APP TO SMARTPHONE  - The patient must first make sure to have activated MyChart and know their login information - If Apple, go to [employer] Store and type in MyChart in the search bar and download the app. If Android, ask patient to go to [employer] Play Store and type in A[facility]MyChart in the search bar and download the app. The app is free but as with any other  app downloads, their phone may require them to verify saved payment information or Apple/Android password.  - The patient will need to then log into the app with their MyChart username and password, and select  as their healthcare provider to link the account. When it is time for your visit, go to the MyChart app, find appointments, and click Begin Video Visit. Be sure to Select Allow for your device to access the Microphone and Camera for your visit. You will then be connected, and your provider will be with you shortly.  **If they have any issues connecting, or need assistance please contact MyChart service desk [phone])**  **If using a computer, in order to ensure the best quality for their visit they will need to use either of the following Internet Browsers: [employer] Edge, or Google Chrome**  IF USING DOXIMITY or DOXY.ME - The patient will receive a link just prior to their visit by text.     FULL LENGTH CONSENT FOR TELE-HEALTH VISIT   I hereby voluntarily request, consent and authorize [facility] and its employed or contracted physicians, physician assistants, nurse practitioners or other licensed health care professionals (the Practitioner), to provide me with telemedicine health care services (the Services") as deemed necessary by the treating Practitioner. I acknowledge and consent to receive the Services by the Practitioner via telemedicine. I understand that the telemedicine visit will involve communicating with the Practitioner through live audiovisual communication technology and the disclosure of certain medical information by electronic transmission. I acknowledge that I have been given the opportunity to request an in-person assessment or other available alternative prior to the telemedicine visit and am voluntarily participating in the telemedicine visit.  I understand that I have the right to withhold or withdraw my consent to the use of  telemedicine in the course of my care at any time, without affecting my right to future care or treatment, and that the Practitioner or I may terminate the telemedicine visit at any time. I understand that I have the right to inspect all information obtained and/or recorded in the course of the telemedicine visit and may receive copies of available information for a reasonable fee.  I understand that some of the potential risks of receiving the Services via telemedicine include:   Delay or interruption in medical evaluation due to technological equipment failure or disruption;  Information transmitted may not be sufficient (e.g. poor resolution of images) to allow for appropriate medical decision making by the Practitioner; and/or   In rare instances, security protocols could fail, causing a breach of personal health information.  Furthermore, I acknowledge that it is my responsibility to provide information about my medical history, conditions and care that is complete and accurate to the best of my ability. I acknowledge that Practitioner's advice, recommendations, and/or decision may be based on factors not within their control, such as incomplete or inaccurate data  provided by me or distortions of diagnostic images or specimens that may result from electronic transmissions. I understand that the practice of medicine is not an exact science and that Practitioner makes no warranties or guarantees regarding treatment outcomes. I acknowledge that I will receive a copy of this consent concurrently upon execution via email to the email address I last provided but may also request a printed copy by calling the office of [place].    I understand that my insurance will be billed for this visit.   I have read or had this consent read to me.  I understand the contents of this consent, which adequately explains the benefits and risks of the Services being provided via telemedicine.   I have been  provided ample opportunity to ask questions regarding this consent and the Services and have had my questions answered to my satisfaction.  I give my informed consent for the services to be provided through the use of telemedicine in my medical care  By participating in this telemedicine visit I agree to the above.

## 2019-03-19 NOTE — Progress Notes (Signed)
Virtual Visit via Video Note   This visit type was conducted due to national recommendations for restrictions regarding the COVID-19 Pandemic (e.g. social distancing) in an effort to limit this patient's exposure and mitigate transmission in our community.  Due to her co-morbid illnesses, this patient is at least at moderate risk for complications without adequate follow up.  This format is felt to be most appropriate for this patient at this time.  All issues noted in this document were discussed and addressed.  A limited physical exam was performed with this format.  Please refer to the patient's chart for her consent to telehealth for [facility].   Date:  03/19/2019   ID:  Kristina Tran, Kristina Tran [birth year], MRN [id number]  Patient Location: Home Provider Location: Office  PCP:  Gaspar Garbe, MD Cardiologist:  Kristeen Miss, MD  Electrophysiologist:  None   Evaluation Performed:  Follow-Up Visit  Chief Complaint:   Follow up HTN, hyperlipidemia,  February 03, 2016:  Doing well.  Just got back from [place].   No CP or dyspnea.  Had some leg swelling bilaterally  Wore some compression hose which helped.  Her primary MD added HCTZ and decreased metoprolol to 1/2   January 29, 2017  Kristina Tran is doing well .  No CP or dyspnea.  No dizziness.  Not exercising much  Just got back form [place] recently .   March 04, 2018: Kristina Tran is seen today for follow-up of her hypertension and atypical chest pain.  Has a history of hyperlipidemia. Has bilateral achilies tendonitits Slowly improving Is on Lasix and HCTZ Discussed the Loung Doctor leg rest.  Has gained some weight .  Playing golf  Looking forward to walking more   Mar 20, 2019    Kristina Tran is a 63 y.o. female with HTN, atypical CP  Has started seeing a nutirtionist. Has lost some weight - 15-20 lbs  Chol  Is 222 HDL is 47 LDL is 131 Trigs are 222 Had a low Vit D level -  No CP ,  No fever, no  cough  Playing golf on occasion . Walks on occasion  No syncope    The patient does not have symptoms concerning for COVID-19 infection (fever, chills, cough, or new shortness of breath).    Past Medical History:  Diagnosis Date  . Bipolar 1 disorder (HCC)   . Chest pain   . History of echocardiogram    a. Echo 9/17: Mild LVH, EF 60-65, no RWMA, Gr 1 DD, trivial MR, normal RVSF, mild RAE  . History of nuclear stress test    a. Nuclear stress test 9/17: EF 75%, no ischemia or scar, normal  . Hypertension   . Hypothyroidism   . Leg edema    Past Surgical History:  Procedure Laterality Date  . BREAST BIOPSY    . BREAST EXCISIONAL BIOPSY Right    2000  . CARDIOVASCULAR STRESS TEST  07/27/2010   EF 74%. NORMAL  . US ECHOCARDIOGRAPHY  09/23/2004   EF 55-60%  . WISDOM TOOTH EXTRACTION       No outpatient medications have been marked as taking for the 03/20/19 encounter (Appointment) with Tiwanda Threats, Deloris Ping, MD.     Allergies:   Sulfonamide derivatives; Limonene; and Penicillins   Social History   Tobacco Use  . Smoking status: Former Smoker    Last attempt to quit: 11/06/1981    Years since quitting: 37.3  . Smokeless tobacco: Former Engineer, water  Use Topics  . Alcohol use: No  . Drug use: No     Family Hx: The patient's family history includes Breast cancer in her mother and sister; Hypertension in her mother.  ROS:   Please see the history of present illness.     All other systems reviewed and are negative.   Prior CV studies:   The following studies were reviewed today:    Labs/Other Tests and Data Reviewed:    EKG:  No ECG reviewed.  Recent Labs: 01/26/2019: ALT 17; BUN 27; Creatinine, Ser 1.30; Hemoglobin 14.0; Platelets 258; Potassium 3.9; Sodium 137   Recent Lipid Panel Lab Results  Component Value Date/Time   CHOL 153 11/25/2013 10:37 AM   TRIG 118.0 11/25/2013 10:37 AM   HDL 63.60 11/25/2013 10:37 AM   CHOLHDL 2 11/25/2013 10:37 AM   LDLCALC  66 11/25/2013 10:37 AM   LDLDIRECT 135.5 10/02/2012 09:36 AM    Wt Readings from Last 3 Encounters:  01/26/19 175 lb (79.4 kg)  12/13/18 174 lb (78.9 kg)  03/04/18 191 lb (86.6 kg)     Objective:    Vital Signs:  There were no vitals taken for this visit.   VITAL SIGNS:  reviewed GEN:  no acute distress EYES:  sclerae anicteric, EOMI - Extraocular Movements Intact RESPIRATORY:  normal respiratory effort, symmetric expansion CARDIOVASCULAR:  no peripheral edema SKIN:  no rash, lesions or ulcers. MUSCULOSKELETAL:  no obvious deformities. NEURO:  alert and oriented x 3, no obvious focal deficit PSYCH:  normal affect  ASSESSMENT & PLAN:    1. Hyperlipidemia:   Is exercising,  Encouraged her to exercise more  2.  Tremor:   On propranolol ,  Works well  3. HTN:   BP is well controlled.  Cont diet and exercise    COVID-19 Education: The signs and symptoms of COVID-19 were discussed with the patient and how to seek care for testing (follow up with PCP or arrange E-visit).  The importance of social distancing was discussed today.  Time:   Today, I have spent  15  minutes with the patient with telehealth technology discussing the above problems.     Medication Adjustments/Labs and Tests Ordered: Current medicines are reviewed at length with the patient today.  Concerns regarding medicines are outlined above.   Tests Ordered: No orders of the defined types were placed in this encounter.   Medication Changes: No orders of the defined types were placed in this encounter.   Disposition:  Follow up in 1 year(s)  Signed, Kristeen MissPhilip Infantof Villagomez, MD  03/19/2019 8:56 PM    [facility]

## 2019-03-20 ENCOUNTER — Telehealth (INDEPENDENT_AMBULATORY_CARE_PROVIDER_SITE_OTHER): Payer: BLUE CROSS/BLUE SHIELD | Admitting: Cardiovascular Disease

## 2019-03-20 ENCOUNTER — Other Ambulatory Visit: Payer: Self-pay

## 2019-03-20 ENCOUNTER — Encounter: Payer: Self-pay | Admitting: Cardiovascular Disease

## 2019-03-20 VITALS — BP 117/89 | HR 68 | Ht 66.0 in | Wt 176.8 lb

## 2019-03-20 DIAGNOSIS — E782 Mixed hyperlipidemia: Secondary | ICD-10-CM | POA: Diagnosis not present

## 2019-03-20 DIAGNOSIS — I10 Essential (primary) hypertension: Secondary | ICD-10-CM | POA: Diagnosis not present

## 2019-03-20 NOTE — Patient Instructions (Signed)

## 2019-04-16 IMAGING — CT CT ANGIOGRAPHY CHEST
1 of 6 series · 3 of 16 positions shown · IV contrast (isovue)
Comparison: None.

CLINICAL DATA: Chest pressure and shortness of breath for 2 days

EXAM:
CT ANGIOGRAPHY CHEST WITH CONTRAST
TECHNIQUE: Multidetector CT imaging of the chest was performed using the
standard protocol during bolus administration of intravenous
contrast. Multiplanar CT image reconstructions and MIPs were
obtained to evaluate the vascular anatomy.
CONTRAST:  55 mL Isovue 370.

[Series 9: pe thins · axial · 0.78mm/px · z∈[+1318,+1434]mm · 3 of 332 slices shown]
[im 83/332  lung]
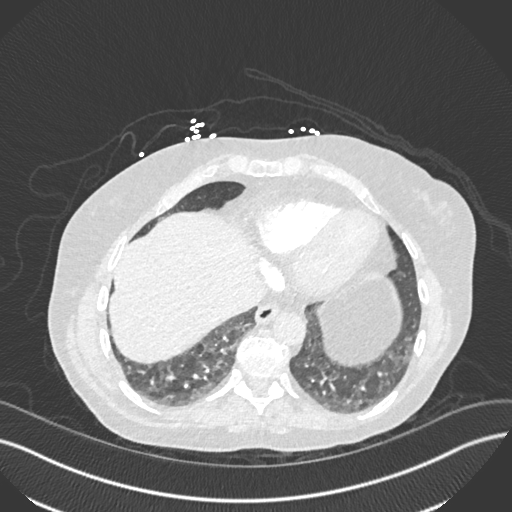
[im 166/332  soft-tissue]
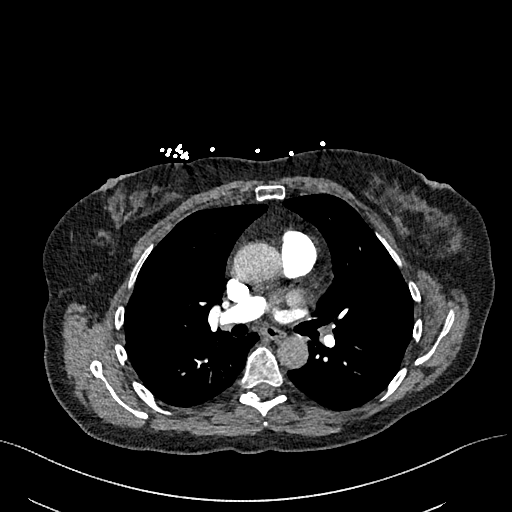
[im 249/332  lung]
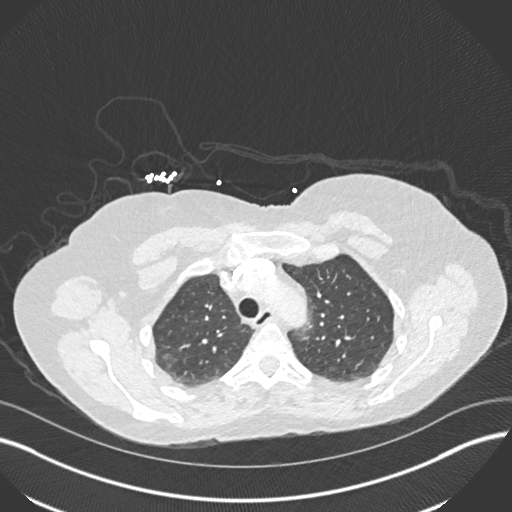

[3 of 16 positions shown; findings below may reference images not displayed]

FINDINGS: Cardiovascular: Thoracic aorta shows no significant atherosclerotic
calcification. No aneurysmal dilatation is seen. The pulmonary
artery shows a normal branching pattern. No filling defects are
identified. No coronary calcifications are identified at this time.

Mediastinum/Nodes: Esophagus as visualize limits. The thoracic inlet
is unremarkable. No hilar or mediastinal adenopathy is seen.

Lungs/Pleura: Lungs are well aerated bilaterally. Mild symmetrical
dependent atelectatic changes are seen. No focal infiltrate or
effusion is seen. No sizable parenchymal nodules are noted.

Upper Abdomen: Visualized upper abdomen is within normal limits.

Musculoskeletal: Mild degenerative changes of the thoracic spine are
noted. No acute rib abnormality is seen.

Review of the MIP images confirms the above findings.
IMPRESSION: No evidence of pulmonary emboli.

Mild dependent atelectatic changes.

No acute abnormality is noted.

## 2019-04-25 DIAGNOSIS — L570 Actinic keratosis: Secondary | ICD-10-CM | POA: Diagnosis not present

## 2019-05-12 ENCOUNTER — Other Ambulatory Visit (INDEPENDENT_AMBULATORY_CARE_PROVIDER_SITE_OTHER): Payer: Self-pay | Admitting: Orthopaedic Surgery

## 2019-05-13 NOTE — Telephone Encounter (Signed)
Ok to rf? 

## 2019-05-13 NOTE — Telephone Encounter (Signed)
OK refill thanks 

## 2019-05-14 ENCOUNTER — Telehealth: Payer: Self-pay | Admitting: Radiology

## 2019-05-14 NOTE — Telephone Encounter (Signed)
Received fax that prior authorization should be done for Tramadol 50mg  tablets that was prescribed yesterday.  The insurance plan does not cover this medication and requires more information. Could you please advise on reason for medication? Thanks.

## 2019-05-14 NOTE — Telephone Encounter (Signed)
u can call her and let her know. You could call in tylenol # 3 instead if they will approve it. thanks

## 2019-05-15 NOTE — Telephone Encounter (Signed)
I called patient. She is going to try and get the Tramadol from the pharmacy today. I advised we were faxed paperwork that insurance did not cover this medication. She will call us back if she needs anything further. She did not want to switch medication to Tylenol #3.

## 2019-05-24 ENCOUNTER — Other Ambulatory Visit: Payer: Self-pay | Admitting: Cardiovascular Disease

## 2019-06-05 DIAGNOSIS — F319 Bipolar disorder, unspecified: Secondary | ICD-10-CM | POA: Diagnosis not present

## 2019-06-05 DIAGNOSIS — Z79899 Other long term (current) drug therapy: Secondary | ICD-10-CM | POA: Diagnosis not present

## 2019-06-10 DIAGNOSIS — F3176 Bipolar disorder, in full remission, most recent episode depressed: Secondary | ICD-10-CM | POA: Diagnosis not present

## 2019-06-25 DIAGNOSIS — D225 Melanocytic nevi of trunk: Secondary | ICD-10-CM | POA: Diagnosis not present

## 2019-06-25 DIAGNOSIS — Z85828 Personal history of other malignant neoplasm of skin: Secondary | ICD-10-CM | POA: Diagnosis not present

## 2019-06-25 DIAGNOSIS — L814 Other melanin hyperpigmentation: Secondary | ICD-10-CM | POA: Diagnosis not present

## 2019-06-25 DIAGNOSIS — L570 Actinic keratosis: Secondary | ICD-10-CM | POA: Diagnosis not present

## 2019-06-25 DIAGNOSIS — L821 Other seborrheic keratosis: Secondary | ICD-10-CM | POA: Diagnosis not present

## 2019-07-03 DIAGNOSIS — Z23 Encounter for immunization: Secondary | ICD-10-CM | POA: Diagnosis not present

## 2019-07-16 DIAGNOSIS — E669 Obesity, unspecified: Secondary | ICD-10-CM | POA: Diagnosis not present

## 2019-07-16 DIAGNOSIS — Z130 Encounter for screening for diseases of the blood and blood-forming organs and certain disorders involving the immune mechanism: Secondary | ICD-10-CM | POA: Diagnosis not present

## 2019-07-16 DIAGNOSIS — Z01419 Encounter for gynecological examination (general) (routine) without abnormal findings: Secondary | ICD-10-CM | POA: Diagnosis not present

## 2019-07-16 DIAGNOSIS — Z124 Encounter for screening for malignant neoplasm of cervix: Secondary | ICD-10-CM | POA: Diagnosis not present

## 2019-07-16 DIAGNOSIS — Z1389 Encounter for screening for other disorder: Secondary | ICD-10-CM | POA: Diagnosis not present

## 2019-08-25 ENCOUNTER — Other Ambulatory Visit: Payer: Self-pay | Admitting: Orthopedic Surgery

## 2019-08-25 ENCOUNTER — Telehealth: Payer: Self-pay | Admitting: Orthopaedic Surgery

## 2019-08-25 MED ORDER — TRAMADOL HCL 50 MG PO TABS: 50.0000 mg | ORAL_TABLET | Freq: Two times a day (BID) | ORAL | 0 refills | Status: DC | PRN

## 2019-08-25 NOTE — Telephone Encounter (Signed)
rx sent

## 2019-08-25 NOTE — Telephone Encounter (Signed)
Patient called needing Rx refilled (Tramadol) Patient said he neck is hurting again. Patient said she is going out of town for 2 weeks and asked for more than 2 tabs because that's what she was given a couple days ago. Patient uses [facility] on [address]. The number to contact patient is  [phone]

## 2019-08-25 NOTE — Telephone Encounter (Signed)
Could you please advise since Dr. Lorin Mercy is out of the office?  Tramadol last rx 05/2019.

## 2019-08-25 NOTE — Telephone Encounter (Signed)
I called patient and advised. 

## 2019-09-03 DIAGNOSIS — H43813 Vitreous degeneration, bilateral: Secondary | ICD-10-CM | POA: Diagnosis not present

## 2019-09-03 DIAGNOSIS — H26493 Other secondary cataract, bilateral: Secondary | ICD-10-CM | POA: Diagnosis not present

## 2019-09-03 DIAGNOSIS — Z961 Presence of intraocular lens: Secondary | ICD-10-CM | POA: Diagnosis not present

## 2019-09-04 DIAGNOSIS — Z5181 Encounter for therapeutic drug level monitoring: Secondary | ICD-10-CM | POA: Diagnosis not present

## 2019-09-04 DIAGNOSIS — E039 Hypothyroidism, unspecified: Secondary | ICD-10-CM | POA: Diagnosis not present

## 2019-09-04 DIAGNOSIS — R030 Elevated blood-pressure reading, without diagnosis of hypertension: Secondary | ICD-10-CM | POA: Diagnosis not present

## 2019-09-04 DIAGNOSIS — F319 Bipolar disorder, unspecified: Secondary | ICD-10-CM | POA: Diagnosis not present

## 2019-09-11 DIAGNOSIS — F319 Bipolar disorder, unspecified: Secondary | ICD-10-CM | POA: Diagnosis not present

## 2019-09-11 DIAGNOSIS — Z79899 Other long term (current) drug therapy: Secondary | ICD-10-CM | POA: Diagnosis not present

## 2019-09-12 DIAGNOSIS — F3176 Bipolar disorder, in full remission, most recent episode depressed: Secondary | ICD-10-CM | POA: Diagnosis not present

## 2019-10-06 DIAGNOSIS — Z79899 Other long term (current) drug therapy: Secondary | ICD-10-CM | POA: Diagnosis not present

## 2019-10-06 DIAGNOSIS — F319 Bipolar disorder, unspecified: Secondary | ICD-10-CM | POA: Diagnosis not present

## 2019-11-24 DIAGNOSIS — E7800 Pure hypercholesterolemia, unspecified: Secondary | ICD-10-CM | POA: Diagnosis not present

## 2019-11-24 DIAGNOSIS — E038 Other specified hypothyroidism: Secondary | ICD-10-CM | POA: Diagnosis not present

## 2019-11-28 DIAGNOSIS — L821 Other seborrheic keratosis: Secondary | ICD-10-CM | POA: Diagnosis not present

## 2019-11-28 DIAGNOSIS — L820 Inflamed seborrheic keratosis: Secondary | ICD-10-CM | POA: Diagnosis not present

## 2019-11-28 DIAGNOSIS — L570 Actinic keratosis: Secondary | ICD-10-CM | POA: Diagnosis not present

## 2019-12-12 DIAGNOSIS — F3176 Bipolar disorder, in full remission, most recent episode depressed: Secondary | ICD-10-CM | POA: Diagnosis not present

## 2019-12-12 DIAGNOSIS — L821 Other seborrheic keratosis: Secondary | ICD-10-CM | POA: Diagnosis not present

## 2020-02-23 DIAGNOSIS — Z1239 Encounter for other screening for malignant neoplasm of breast: Secondary | ICD-10-CM | POA: Diagnosis not present

## 2020-02-24 DIAGNOSIS — Z1231 Encounter for screening mammogram for malignant neoplasm of breast: Secondary | ICD-10-CM | POA: Diagnosis not present

## 2020-02-24 DIAGNOSIS — Z1239 Encounter for other screening for malignant neoplasm of breast: Secondary | ICD-10-CM | POA: Diagnosis not present

## 2020-03-01 DIAGNOSIS — E038 Other specified hypothyroidism: Secondary | ICD-10-CM | POA: Diagnosis not present

## 2020-03-01 DIAGNOSIS — M810 Age-related osteoporosis without current pathological fracture: Secondary | ICD-10-CM | POA: Diagnosis not present

## 2020-03-01 DIAGNOSIS — F319 Bipolar disorder, unspecified: Secondary | ICD-10-CM | POA: Diagnosis not present

## 2020-03-01 DIAGNOSIS — Z0000 Encounter for general adult medical examination without abnormal findings: Secondary | ICD-10-CM | POA: Diagnosis not present

## 2020-03-09 DIAGNOSIS — Z1331 Encounter for screening for depression: Secondary | ICD-10-CM | POA: Diagnosis not present

## 2020-03-09 DIAGNOSIS — Z0000 Encounter for general adult medical examination without abnormal findings: Secondary | ICD-10-CM | POA: Diagnosis not present

## 2020-03-09 DIAGNOSIS — N1832 Chronic kidney disease, stage 3b: Secondary | ICD-10-CM | POA: Diagnosis not present

## 2020-03-09 DIAGNOSIS — M810 Age-related osteoporosis without current pathological fracture: Secondary | ICD-10-CM | POA: Diagnosis not present

## 2020-03-09 DIAGNOSIS — E7800 Pure hypercholesterolemia, unspecified: Secondary | ICD-10-CM | POA: Diagnosis not present

## 2020-03-09 DIAGNOSIS — I129 Hypertensive chronic kidney disease with stage 1 through stage 4 chronic kidney disease, or unspecified chronic kidney disease: Secondary | ICD-10-CM | POA: Diagnosis not present

## 2020-03-10 DIAGNOSIS — L570 Actinic keratosis: Secondary | ICD-10-CM | POA: Diagnosis not present

## 2020-03-10 DIAGNOSIS — L820 Inflamed seborrheic keratosis: Secondary | ICD-10-CM | POA: Diagnosis not present

## 2020-03-10 DIAGNOSIS — L853 Xerosis cutis: Secondary | ICD-10-CM | POA: Diagnosis not present

## 2020-03-10 DIAGNOSIS — Z85828 Personal history of other malignant neoplasm of skin: Secondary | ICD-10-CM | POA: Diagnosis not present

## 2020-03-11 DIAGNOSIS — Z1212 Encounter for screening for malignant neoplasm of rectum: Secondary | ICD-10-CM | POA: Diagnosis not present

## 2020-03-18 ENCOUNTER — Ambulatory Visit: Payer: Self-pay

## 2020-03-18 ENCOUNTER — Ambulatory Visit (INDEPENDENT_AMBULATORY_CARE_PROVIDER_SITE_OTHER): Payer: BC Managed Care – PPO | Admitting: Orthopaedic Surgery

## 2020-03-18 ENCOUNTER — Encounter: Payer: Self-pay | Admitting: Cardiovascular Disease

## 2020-03-18 ENCOUNTER — Other Ambulatory Visit: Payer: Self-pay

## 2020-03-18 VITALS — BP 99/64 | HR 74

## 2020-03-18 DIAGNOSIS — M79605 Pain in left leg: Secondary | ICD-10-CM | POA: Diagnosis not present

## 2020-03-18 DIAGNOSIS — M7062 Trochanteric bursitis, left hip: Secondary | ICD-10-CM

## 2020-03-18 MED ORDER — METHYLPREDNISOLONE ACETATE 40 MG/ML IJ SUSP
40.0000 mg | INTRAMUSCULAR | Status: AC | PRN
  Administered 2020-03-18: 40 mg via INTRA_ARTICULAR

## 2020-03-18 MED ORDER — LIDOCAINE HCL 1 % IJ SOLN
0.5000 mL | INTRAMUSCULAR | Status: AC | PRN
  Administered 2020-03-18: .5 mL

## 2020-03-18 MED ORDER — BUPIVACAINE HCL 0.25 % IJ SOLN
2.0000 mL | INTRAMUSCULAR | Status: AC | PRN
  Administered 2020-03-18: 2 mL via INTRA_ARTICULAR

## 2020-03-18 MED ORDER — TRAMADOL HCL 50 MG PO TABS: 50.0000 mg | ORAL_TABLET | Freq: Two times a day (BID) | ORAL | 0 refills | Status: DC | PRN

## 2020-03-18 NOTE — Progress Notes (Signed)
Cardiology Office Note   Date:  03/19/2020   ID:  Kristina Tran, Kristina Tran 06-07-1956, MRN [id number]  PCP:  Kristina Boston, MD  Cardiologist:   Kristina Moores, MD   Chief Complaint  Patient presents with  . Hypertension        Kristina Tran is a 64 y.o. female who presents for follow up Hx of HTN, hyperlipidemia , hypothyroidsim, bipolar disease.  Kristina Tran has done well .  No episodes of CP ,  Dyspnea.  February 03, 2016:  Doing well.  Just got back from [place].   No CP or dyspnea.  Had some leg swelling bilaterally  Wore some compression hose which helped.  Her primary MD added HCTZ and decreased metoprolol to 1/2   January 29, 2017  Kristina Tran is doing well .  No CP or dyspnea.  No dizziness.  Not exercising much  Just got back form [place] recently .   March 04, 2018: Kristina Tran is seen today for follow-up of her hypertension and atypical chest pain.  Has a history of hyperlipidemia. Has bilateral achilies tendonitits Slowly improving Is on Lasix and HCTZ Discussed the Loung Doctor leg rest.  Has gained some weight .  Playing golf  Looking forward to walking more   Mar 19, 2020: Kristina Tran is seen today for follow up of her HTN, HLD ,  and atypical CP  No cardiic issues.  Walks some, Plays golf regularly  Boxes once a week . Lipids are managed by Dr. Jacalyn Tran ( [facility])  Kristina Tran were high. She loves mayonaise and eats quite a bit of mayonnaise on a weekly basis.  Past Medical History:  Diagnosis Date  . Bipolar 1 disorder ([place])   . Chest pain   . History of echocardiogram    a. Echo 9/17: Mild LVH, EF 60-65, no RWMA, Gr 1 DD, trivial MR, normal RVSF, mild RAE  . History of nuclear stress test    a. Nuclear stress test 9/17: EF 75%, no ischemia or scar, normal  . Hypertension   . Hypothyroidism   . Leg edema     Past Surgical History:  Procedure Laterality Date  . BREAST BIOPSY    . BREAST EXCISIONAL BIOPSY Right    2000  . CARDIOVASCULAR  STRESS TEST  07/27/2010   EF 74%. NORMAL  . US ECHOCARDIOGRAPHY  09/23/2004   EF 55-60%  . WISDOM TOOTH EXTRACTION       Current Outpatient Medications  Medication Sig Dispense Refill  . alendronate (FOSAMAX) 35 MG tablet Take 35 mg by mouth every 7 (seven) days. Take with a full glass of water on an empty stomach.    . ARIPiprazole (ABILIFY) 5 MG tablet Take 5 mg by mouth daily.     Marland Kitchen buPROPion (WELLBUTRIN XL) 150 MG 24 hr tablet Take 150 mg by mouth daily.      . cyclobenzaprine (FLEXERIL) 5 MG tablet cyclobenzaprine 5 mg tablet    . divalproex (DEPAKOTE) 500 MG DR tablet Take 1,000 mg by mouth 2 (two) times daily.     . hydrochlorothiazide (HYDRODIURIL) 25 MG tablet Take 25 mg by mouth daily.  1  . levothyroxine (SYNTHROID, LEVOTHROID) 100 MCG tablet Take 100 mcg by mouth daily.      Marland Kitchen LORazepam (ATIVAN) 0.5 MG tablet Take 0.5 mg by mouth every 8 (eight) hours as needed for sleep.     . propranolol (INDERAL) 40 MG tablet Take 40 mg by mouth daily.     Marland Kitchen  rosuvastatin (CRESTOR) 20 MG tablet TAKE 1 TABLET BY MOUTH DAILY 90 tablet 3  . traMADol (ULTRAM) 50 MG tablet Take 1 tablet (50 mg total) by mouth every 12 (twelve) hours as needed. for pain 30 tablet 0   No current facility-administered medications for this visit.    Allergies:   Sulfa antibiotics, Sulfonamide derivatives, Limonene, and Penicillins    Social History:  The patient  reports that she quit smoking about 38 years ago. She has quit using smokeless tobacco. She reports that she does not drink alcohol or use drugs.   Family History:  The patient's family history includes Breast cancer in her mother and sister; Hypertension in her mother.    ROS: Noted in current history, all other review of systems are negative.   Physical Exam: Blood pressure 108/80, pulse 74, height 5' 5.5" (1.664 m), weight 180 lb (81.6 kg), SpO2 97 %.  GEN:  Well nourished, well developed in no acute distress HEENT: Normal NECK: No JVD; No  carotid bruits LYMPHATICS: No lymphadenopathy CARDIAC: RRR , no murmurs, rubs, gallops RESPIRATORY:  Clear to auscultation without rales, wheezing or rhonchi  ABDOMEN: Soft, non-tender, non-distended MUSCULOSKELETAL:  No edema; No deformity  SKIN: Warm and dry NEUROLOGIC:  Alert and oriented x 3    EKG:  Mar 19, 2020: Normal sinus rhythm at 73 beats a minute.  No ST or T wave changes.  Recent Labs: No results found for requested labs within last 8760 hours.    Lipid Panel    Component Value Date/Time   CHOL 153 11/25/2013 1037   TRIG 118.0 11/25/2013 1037   HDL 63.60 11/25/2013 1037   CHOLHDL 2 11/25/2013 1037   VLDL 23.6 11/25/2013 1037   LDLCALC 66 11/25/2013 1037   LDLDIRECT 135.5 10/02/2012 0936      Wt Readings from Last 3 Encounters:  03/19/20 180 lb (81.6 kg)  03/20/19 176 lb 12.8 oz (80.2 kg)  01/26/19 175 lb (79.4 kg)      Other studies Reviewed: Additional studies/ records that were reviewed today include: . Review of the above records demonstrates:    ASSESSMENT AND PLAN:  1.  HTN:   Blood pressure remains well controlled.  Continue current medications.   2. Hyperlipidemia.  Managed by Dr. Nadene Tran .   Her chol levels look ok.  Trigs are elelvated - mostly due to diet.  I encouraged her to work on her diet and to work on her cardio exercise.   3.  Bilateral leg edema:  Better   Will see her in 1 year.   Current medicines are reviewed at length with the patient today.  The patient does not have concerns regarding medicines.  The following changes have been made:  no change  Labs/ tests ordered today include:   Orders Placed This Encounter  Procedures  . EKG 12-Lead      Signed, Kristina Miss, MD  03/19/2020 3:12 PM    [facility] [address] [place], [place], [place]  [address] Phone: [phone]; Fax: [phone]

## 2020-03-18 NOTE — Progress Notes (Signed)
Office Visit Note   Patient: Kristina Tran           Date of Birth: 29-Dec-1955           MRN: [id number] Visit Date: 03/18/2020              Requested by: Gaspar Garbe, MD [address] [place],  [place] [address] PCP: Gaspar Garbe, MD   Assessment & Plan: Visit Diagnoses:  1. Pain in left leg   2. Trochanteric bursitis, left hip     Plan: Left trochanteric injection performed which she tolerated well.  Ultram refill sent in.  She has persistent problems she will let us know.  If she has increased central back pain we can obtain some oblique x-rays to evaluate her for possible pars defect at L4.  She will avoid playing golf this week later symptoms settle down and then can gradually resume activities as tolerated.  Follow-Up Instructions: No follow-ups on file.   Orders:  Orders Placed This Encounter  Procedures  . XR Lumbar Spine 2-3 Views   Meds ordered this encounter  Medications  . traMADol (ULTRAM) 50 MG tablet    Sig: Take 1 tablet (50 mg total) by mouth every 12 (twelve) hours as needed. for pain    Dispense:  30 tablet    Refill:  0      Procedures: Large Joint Inj: L greater trochanter on 03/18/2020 12:57 PM Indications: pain Details: 22 G 1.5 in needle, lateral approach Medications: 0.5 mL lidocaine 1 %; 2 mL bupivacaine 0.25 %; 40 mg methylPREDNISolone acetate 40 MG/ML      Clinical Data: No additional findings.   Subjective: Chief Complaint  Patient presents with  . Left Leg - Pain    HPI 65 year old female with 6 days history of left buttocks pain that radiates from sacroiliac joint to the trochanter with difficulty walking difficulty sitting in a chair.  She states she been playing some of the best golf over her life and was playing more and then had gradually increased pain to the point where she was limping with ambulation.  She points laterally over the trochanter on the left where she has had pain no pain on the right no  associated bowel or bladder symptoms.  Review of Systems 14 point systems updated.  Patient is here with her husband Onalee Hua she has had problems hypertension hyperlipidemia bipolar disorder.   Objective: Vital Signs: BP 99/64   Pulse 74   Physical Exam Constitutional:      Appearance: She is well-developed.  HENT:     Head: Normocephalic.     Right Ear: External ear normal.     Left Ear: External ear normal.  Eyes:     Pupils: Pupils are equal, round, and reactive to light.  Neck:     Thyroid: No thyromegaly.     Trachea: No tracheal deviation.  Cardiovascular:     Rate and Rhythm: Normal rate.  Pulmonary:     Effort: Pulmonary effort is normal.  Abdominal:     Palpations: Abdomen is soft.  Skin:    General: Skin is warm and dry.  Neurological:     Mental Status: She is alert and oriented to person, place, and time.  Psychiatric:        Behavior: Behavior normal.     Ortho Exam patient has negative logroll to the hips negative straight leg raising 90 degrees.  Sciatic notch tenderness on the left mild some tenderness  at the L4-5 interspinous space.  No right trochanteric bursal tenderness.  Anterior tib gastrocsoleus is strong.  Distal pulses are 2+.  Specialty Comments:  No specialty comments available.  Imaging: AP lateral lumbar spine x-rays demonstrates mild lower left lumbar curvature.  Facet arthropathy noted L4-5 without oblique images obtained.  There is 3 mm anterolisthesis at L4-5.  Impression: Lumbar facet arthropathy with some anterolisthesis at L4-5 grade 1.   PMFS History: Patient Active Problem List   Diagnosis Date Noted  . Trochanteric bursitis, left hip 03/18/2020  . Hyperlipidemia 11/25/2013  . Bipolar affective disorder ([place]) 10/02/2012  . HTN (hypertension) 09/22/2011  . Hypothyroidism 09/22/2011   Past Medical History:  Diagnosis Date  . Bipolar 1 disorder ([place])   . Chest pain   . History of echocardiogram    a. Echo 9/17: Mild LVH, EF  60-65, no RWMA, Gr 1 DD, trivial MR, normal RVSF, mild RAE  . History of nuclear stress test    a. Nuclear stress test 9/17: EF 75%, no ischemia or scar, normal  . Hypertension   . Hypothyroidism   . Leg edema     Family History  Problem Relation Age of Onset  . Breast cancer Mother   . Hypertension Mother   . Breast cancer Sister     Past Surgical History:  Procedure Laterality Date  . BREAST BIOPSY    . BREAST EXCISIONAL BIOPSY Right    2000  . CARDIOVASCULAR STRESS TEST  07/27/2010   EF 74%. NORMAL  . US ECHOCARDIOGRAPHY  09/23/2004   EF 55-60%  . WISDOM TOOTH EXTRACTION     Social History   Occupational History  . Not on file  Tobacco Use  . Smoking status: Former Smoker    Quit date: 11/06/1981    Years since quitting: 38.3  . Smokeless tobacco: Former Network engineer and Sexual Activity  . Alcohol use: No  . Drug use: No  . Sexual activity: Not on file

## 2020-03-19 ENCOUNTER — Encounter: Payer: Self-pay | Admitting: Cardiovascular Disease

## 2020-03-19 ENCOUNTER — Other Ambulatory Visit: Payer: Self-pay

## 2020-03-19 ENCOUNTER — Ambulatory Visit (INDEPENDENT_AMBULATORY_CARE_PROVIDER_SITE_OTHER): Payer: BC Managed Care – PPO | Admitting: Cardiovascular Disease

## 2020-03-19 VITALS — BP 108/80 | HR 74 | Ht 65.5 in | Wt 180.0 lb

## 2020-03-19 DIAGNOSIS — I10 Essential (primary) hypertension: Secondary | ICD-10-CM | POA: Diagnosis not present

## 2020-03-19 DIAGNOSIS — E782 Mixed hyperlipidemia: Secondary | ICD-10-CM

## 2020-03-19 NOTE — Patient Instructions (Signed)

## 2020-03-29 DIAGNOSIS — F3176 Bipolar disorder, in full remission, most recent episode depressed: Secondary | ICD-10-CM | POA: Diagnosis not present

## 2020-03-31 DIAGNOSIS — M810 Age-related osteoporosis without current pathological fracture: Secondary | ICD-10-CM | POA: Diagnosis not present

## 2020-04-22 DIAGNOSIS — F319 Bipolar disorder, unspecified: Secondary | ICD-10-CM | POA: Diagnosis not present

## 2020-04-22 DIAGNOSIS — E7800 Pure hypercholesterolemia, unspecified: Secondary | ICD-10-CM | POA: Diagnosis not present

## 2020-04-22 DIAGNOSIS — Z79899 Other long term (current) drug therapy: Secondary | ICD-10-CM | POA: Diagnosis not present

## 2020-05-12 ENCOUNTER — Other Ambulatory Visit (HOSPITAL_COMMUNITY): Payer: Self-pay

## 2020-05-13 ENCOUNTER — Other Ambulatory Visit: Payer: Self-pay

## 2020-05-13 ENCOUNTER — Ambulatory Visit (HOSPITAL_COMMUNITY)
Admission: RE | Admit: 2020-05-13 | Discharge: 2020-05-13 | Disposition: A | Discharge status: Home / Self Care | Payer: BC Managed Care – PPO | Source: Ambulatory Visit | Attending: Internal Medicine | Admitting: Internal Medicine

## 2020-05-13 DIAGNOSIS — M810 Age-related osteoporosis without current pathological fracture: Secondary | ICD-10-CM | POA: Insufficient documentation

## 2020-05-13 MED ORDER — ZOLEDRONIC ACID 5 MG/100ML IV SOLN: 5.0000 mg | Freq: Once | INTRAVENOUS | Status: AC

## 2020-05-13 MED ORDER — ZOLEDRONIC ACID 5 MG/100ML IV SOLN
INTRAVENOUS | Status: AC
  Administered 2020-05-13: 5 mg via INTRAVENOUS
  Filled 2020-05-13: qty 100

## 2020-05-13 NOTE — Discharge Instructions (Signed)

## 2020-06-01 ENCOUNTER — Other Ambulatory Visit: Payer: Self-pay

## 2020-06-01 MED ORDER — ROSUVASTATIN CALCIUM 20 MG PO TABS: 20.0000 mg | ORAL_TABLET | Freq: Every day | ORAL | 2 refills | Status: DC

## 2020-07-14 DIAGNOSIS — E7800 Pure hypercholesterolemia, unspecified: Secondary | ICD-10-CM | POA: Diagnosis not present

## 2020-07-14 DIAGNOSIS — F319 Bipolar disorder, unspecified: Secondary | ICD-10-CM | POA: Diagnosis not present

## 2020-07-19 DIAGNOSIS — Z124 Encounter for screening for malignant neoplasm of cervix: Secondary | ICD-10-CM | POA: Diagnosis not present

## 2020-07-19 DIAGNOSIS — Z1389 Encounter for screening for other disorder: Secondary | ICD-10-CM | POA: Diagnosis not present

## 2020-07-19 DIAGNOSIS — Z01419 Encounter for gynecological examination (general) (routine) without abnormal findings: Secondary | ICD-10-CM | POA: Diagnosis not present

## 2020-07-19 DIAGNOSIS — Z130 Encounter for screening for diseases of the blood and blood-forming organs and certain disorders involving the immune mechanism: Secondary | ICD-10-CM | POA: Diagnosis not present

## 2020-08-05 DIAGNOSIS — L905 Scar conditions and fibrosis of skin: Secondary | ICD-10-CM | POA: Diagnosis not present

## 2020-08-09 DIAGNOSIS — F3176 Bipolar disorder, in full remission, most recent episode depressed: Secondary | ICD-10-CM | POA: Diagnosis not present

## 2020-08-10 DIAGNOSIS — M7989 Other specified soft tissue disorders: Secondary | ICD-10-CM | POA: Diagnosis not present

## 2020-08-10 DIAGNOSIS — M79661 Pain in right lower leg: Secondary | ICD-10-CM | POA: Diagnosis not present

## 2020-08-10 DIAGNOSIS — R600 Localized edema: Secondary | ICD-10-CM | POA: Diagnosis not present

## 2020-08-11 DIAGNOSIS — R600 Localized edema: Secondary | ICD-10-CM | POA: Diagnosis not present

## 2020-09-21 DIAGNOSIS — I129 Hypertensive chronic kidney disease with stage 1 through stage 4 chronic kidney disease, or unspecified chronic kidney disease: Secondary | ICD-10-CM | POA: Diagnosis not present

## 2020-09-21 DIAGNOSIS — M25512 Pain in left shoulder: Secondary | ICD-10-CM | POA: Diagnosis not present

## 2020-09-21 DIAGNOSIS — E785 Hyperlipidemia, unspecified: Secondary | ICD-10-CM | POA: Diagnosis not present

## 2020-09-24 DIAGNOSIS — E785 Hyperlipidemia, unspecified: Secondary | ICD-10-CM | POA: Diagnosis not present

## 2020-09-24 DIAGNOSIS — Z79899 Other long term (current) drug therapy: Secondary | ICD-10-CM | POA: Diagnosis not present

## 2020-10-18 DIAGNOSIS — D0461 Carcinoma in situ of skin of right upper limb, including shoulder: Secondary | ICD-10-CM | POA: Diagnosis not present

## 2020-11-08 DIAGNOSIS — F3176 Bipolar disorder, in full remission, most recent episode depressed: Secondary | ICD-10-CM | POA: Diagnosis not present

## 2020-11-29 ENCOUNTER — Emergency Department (HOSPITAL_COMMUNITY)
Admission: EM | Admit: 2020-11-29 | Discharge: 2020-11-29 | Disposition: A | Discharge status: Home / Self Care | Payer: BC Managed Care – PPO | Attending: Emergency Medicine | Admitting: Emergency Medicine

## 2020-11-29 ENCOUNTER — Other Ambulatory Visit: Payer: Self-pay

## 2020-11-29 ENCOUNTER — Encounter (HOSPITAL_COMMUNITY): Payer: Self-pay | Admitting: *Deleted

## 2020-11-29 ENCOUNTER — Emergency Department (HOSPITAL_COMMUNITY): Payer: BC Managed Care – PPO

## 2020-11-29 DIAGNOSIS — S066X0A Traumatic subarachnoid hemorrhage without loss of consciousness, initial encounter: Secondary | ICD-10-CM | POA: Diagnosis not present

## 2020-11-29 DIAGNOSIS — Z87891 Personal history of nicotine dependence: Secondary | ICD-10-CM | POA: Diagnosis not present

## 2020-11-29 DIAGNOSIS — S0990XA Unspecified injury of head, initial encounter: Secondary | ICD-10-CM | POA: Diagnosis not present

## 2020-11-29 DIAGNOSIS — W000XXA Fall on same level due to ice and snow, initial encounter: Secondary | ICD-10-CM | POA: Diagnosis not present

## 2020-11-29 DIAGNOSIS — S065X9A Traumatic subdural hemorrhage with loss of consciousness of unspecified duration, initial encounter: Secondary | ICD-10-CM

## 2020-11-29 DIAGNOSIS — I10 Essential (primary) hypertension: Secondary | ICD-10-CM | POA: Insufficient documentation

## 2020-11-29 DIAGNOSIS — E039 Hypothyroidism, unspecified: Secondary | ICD-10-CM | POA: Insufficient documentation

## 2020-11-29 DIAGNOSIS — I6529 Occlusion and stenosis of unspecified carotid artery: Secondary | ICD-10-CM | POA: Diagnosis not present

## 2020-11-29 DIAGNOSIS — M47812 Spondylosis without myelopathy or radiculopathy, cervical region: Secondary | ICD-10-CM | POA: Diagnosis not present

## 2020-11-29 DIAGNOSIS — S065X0A Traumatic subdural hemorrhage without loss of consciousness, initial encounter: Secondary | ICD-10-CM | POA: Insufficient documentation

## 2020-11-29 DIAGNOSIS — I609 Nontraumatic subarachnoid hemorrhage, unspecified: Secondary | ICD-10-CM | POA: Diagnosis not present

## 2020-11-29 DIAGNOSIS — Z79899 Other long term (current) drug therapy: Secondary | ICD-10-CM | POA: Insufficient documentation

## 2020-11-29 DIAGNOSIS — I6201 Nontraumatic acute subdural hemorrhage: Secondary | ICD-10-CM | POA: Diagnosis not present

## 2020-11-29 DIAGNOSIS — S065XAA Traumatic subdural hemorrhage with loss of consciousness status unknown, initial encounter: Secondary | ICD-10-CM

## 2020-11-29 DIAGNOSIS — Z20822 Contact with and (suspected) exposure to covid-19: Secondary | ICD-10-CM | POA: Insufficient documentation

## 2020-11-29 DIAGNOSIS — S199XXA Unspecified injury of neck, initial encounter: Secondary | ICD-10-CM | POA: Diagnosis not present

## 2020-11-29 LAB — SARS CORONAVIRUS 2 (TAT 6-24 HRS): SARS Coronavirus 2: NEGATIVE

## 2020-11-29 MED ORDER — OXYCODONE-ACETAMINOPHEN 5-325 MG PO TABS
1.0000 | ORAL_TABLET | Freq: Once | ORAL | Status: AC
  Administered 2020-11-29: 1 via ORAL
  Filled 2020-11-29: qty 1

## 2020-11-29 NOTE — Discharge Instructions (Signed)
You did sustain a small area of bleeding in your brain from your fall earlier, I spoke with the neurosurgeon who would like to see you tomorrow, 11/30/2020 at 12:30 PM.  Their address and number is listed on the paperwork, if you develop worsening headache, vomiting, dizziness, weakness on one side of your body, troubles with speech, visual changes, or any other concerns please return to the emergency department.

## 2020-11-29 NOTE — ED Triage Notes (Signed)
Pt reports falling on the ice today. Hit mid back and then back of head. No loc. Is not on blood thinners. Ambulatory at triage.

## 2020-11-29 NOTE — ED Provider Notes (Signed)
[facility]   CSN: [id number] Arrival date & time: 11/29/20  0915     History Chief Complaint  Patient presents with  . Fall    Kristina Tran is a 65 y.o. female.   Fall This is a new problem. The current episode started 6 to 12 hours ago. The problem has been gradually improving. Associated symptoms include headaches. Pertinent negatives include no chest pain, no abdominal pain and no shortness of breath. Nothing aggravates the symptoms. Nothing relieves the symptoms. She has tried nothing for the symptoms.  Headache Associated symptoms: no abdominal pain, no back pain, no cough, no ear pain, no eye pain, no fever, no seizures, no sore throat and no vomiting   Trauma Mechanism of injury: fall Injury location: head/neck Injury location detail: head Incident location: home Time since incident: 9 hours Arrived directly from scene: yes   Fall:      Fall occurred: slipped on black ice.      Impact surface: concrete      Point of impact: head  Current symptoms:      Associated symptoms:            Reports headache.            Denies abdominal pain, back pain, chest pain, seizures and vomiting.       Past Medical History:  Diagnosis Date  . Bipolar 1 disorder ([facility])   . Chest pain   . History of echocardiogram    a. Echo 9/17: Mild LVH, EF 60-65, no RWMA, Gr 1 DD, trivial MR, normal RVSF, mild RAE  . History of nuclear stress test    a. Nuclear stress test 9/17: EF 75%, no ischemia or scar, normal  . Hypertension   . Hypothyroidism   . Leg edema     Patient Active Problem List   Diagnosis Date Noted  . Trochanteric bursitis, left hip 03/18/2020  . Hyperlipidemia 11/25/2013  . Bipolar affective disorder ([facility]) 10/02/2012  . HTN (hypertension) 09/22/2011  . Hypothyroidism 09/22/2011    Past Surgical History:  Procedure Laterality Date  . BREAST BIOPSY    . BREAST EXCISIONAL BIOPSY Right    2000   . CARDIOVASCULAR STRESS TEST  07/27/2010   EF 74%. NORMAL  . US ECHOCARDIOGRAPHY  09/23/2004   EF 55-60%  . WISDOM TOOTH EXTRACTION       OB History   No obstetric history on file.     Family History  Problem Relation Age of Onset  . Breast cancer Mother   . Hypertension Mother   . Breast cancer Sister     Social History   Tobacco Use  . Smoking status: Former Smoker    Quit date: 11/06/1981    Years since quitting: 39.0  . Smokeless tobacco: Former Engineer, waterUser  Substance Use Topics  . Alcohol use: No  . Drug use: No    Home Medications Prior to Admission medications   Medication Sig Start Date End Date Taking? Authorizing Provider  alendronate (FOSAMAX) 35 MG tablet Take 35 mg by mouth every 7 (seven) days. Take with a full glass of water on an empty stomach.    [provider]  ARIPiprazole (ABILIFY) 5 MG tablet Take 5 mg by mouth daily.  11/26/18   [provider]  buPROPion (WELLBUTRIN XL) 150 MG 24 hr tablet Take 150 mg by mouth daily.      [provider]  cyclobenzaprine (FLEXERIL) 5 MG tablet  cyclobenzaprine 5 mg tablet    [provider]  divalproex (DEPAKOTE) 500 MG DR tablet Take 1,000 mg by mouth 2 (two) times daily.     [provider]  hydrochlorothiazide (HYDRODIURIL) 25 MG tablet Take 25 mg by mouth daily. 01/28/16   [provider]  levothyroxine (SYNTHROID, LEVOTHROID) 100 MCG tablet Take 100 mcg by mouth daily.      [provider]  LORazepam (ATIVAN) 0.5 MG tablet Take 0.5 mg by mouth every 8 (eight) hours as needed for sleep.     [provider]  propranolol (INDERAL) 40 MG tablet Take 40 mg by mouth daily.  12/21/16   [provider]  rosuvastatin (CRESTOR) 20 MG tablet Take 1 tablet (20 mg total) by mouth daily. 06/01/20   Nahser, Deloris Ping, MD  traMADol (ULTRAM) 50 MG tablet Take 1 tablet (50 mg total) by mouth every 12 (twelve) hours as needed. for pain 03/18/20   Eldred Manges, MD     Allergies    Sulfa antibiotics, Sulfonamide derivatives, Limonene, and Penicillins  Review of Systems   Review of Systems  Constitutional: Negative for chills and fever.  HENT: Negative for ear pain and sore throat.   Eyes: Negative for pain and visual disturbance.  Respiratory: Negative for cough and shortness of breath.   Cardiovascular: Negative for chest pain and palpitations.  Gastrointestinal: Negative for abdominal pain and vomiting.  Genitourinary: Negative for dysuria and hematuria.  Musculoskeletal: Negative for arthralgias and back pain.  Skin: Negative for color change and rash.  Neurological: Positive for headaches. Negative for seizures and syncope.  All other systems reviewed and are negative.   Physical Exam Updated Vital Signs BP 129/88   Pulse 74   Temp 98.6 F (37 C) (Oral)   Resp 19   Ht 5\' 6"  (1.676 m)   Wt 83.9 kg   SpO2 92%   BMI 29.86 kg/m   Physical Exam Vitals and nursing note reviewed.  Constitutional:      General: She is not in acute distress.    Appearance: She is well-developed and well-nourished.  HENT:     Head: Normocephalic and atraumatic.     Comments: Posterior scalp tenderness without significant hematoma. No abrasion/laceration Eyes:     Conjunctiva/sclera: Conjunctivae normal.  Cardiovascular:     Rate and Rhythm: Normal rate and regular rhythm.     Heart sounds: No murmur heard.   Pulmonary:     Effort: Pulmonary effort is normal. No respiratory distress.     Breath sounds: Normal breath sounds.  Abdominal:     Palpations: Abdomen is soft.     Tenderness: There is no abdominal tenderness.  Musculoskeletal:        General: No edema.     Cervical back: Neck supple.  Skin:    General: Skin is warm and dry.  Neurological:     Mental Status: She is alert.     GCS: GCS eye subscore is 4. GCS verbal subscore is 5. GCS motor subscore is 6.     Cranial Nerves: Cranial nerves are intact.     Sensory: Sensation is  intact.     Motor: Motor function is intact.     Gait: Gait is intact. Gait normal.  Psychiatric:        Mood and Affect: Mood and affect normal.     ED Results / Procedures / Treatments   Labs (all labs ordered are listed, but only abnormal results are displayed)  Labs Reviewed  SARS CORONAVIRUS 2 (TAT 6-24 HRS)    EKG None  Radiology CT Head Wo Contrast  Result Date: 11/29/2020 CLINICAL DATA:  Larey Seat on black ice EXAM: CT HEAD WITHOUT CONTRAST CT CERVICAL SPINE WITHOUT CONTRAST TECHNIQUE: Multidetector CT imaging of the head and cervical spine was performed following the standard protocol without intravenous contrast. Multiplanar CT image reconstructions of the cervical spine were also generated. COMPARISON:  CT brain 06/17/2015 FINDINGS: CT HEAD FINDINGS Brain: No acute territorial infarction or intracranial mass is visualized. Minimal 2 mm right parafalcine acute subdural hematoma with trace amount of adjacent subarachnoid hemorrhage. No significant mass effect. No midline shift. Asymmetric anterior volume loss as before. Stable ventricle size. Vascular: No hyperdense vessels.  Carotid vascular calcification Skull: Normal. Negative for fracture or focal lesion. Sinuses/Orbits: No acute finding. Other: None CT CERVICAL SPINE FINDINGS Alignment: No subluxation.  Facet alignment within normal limits Skull base and vertebrae: No fracture. Incomplete fusion posterior arch of C1. Soft tissues and spinal canal: No prevertebral fluid or swelling. No visible canal hematoma. Disc levels: Mild disc space narrowing and degenerative change C5-C6 and C6-C7. Mild facet degenerative changes at multiple levels. Upper chest: Negative. Other: None IMPRESSION: 1. Minimal 2 mm right parafalcine acute subdural hematoma with trace amount of adjacent subarachnoid hemorrhage. No significant mass effect or midline shift. 2. Mild degenerative changes of the cervical spine. No acute osseous abnormality. Critical  Value/emergent results were called by telephone at the time of interpretation on 11/29/2020 at 6:11 pm to provider JOSEPH ZAMMIT , who verbally acknowledged these results. Electronically Signed   By: Jasmine Pang M.D.   On: 11/29/2020 18:11   CT Cervical Spine Wo Contrast  Result Date: 11/29/2020 CLINICAL DATA:  Larey Seat on black ice EXAM: CT HEAD WITHOUT CONTRAST CT CERVICAL SPINE WITHOUT CONTRAST TECHNIQUE: Multidetector CT imaging of the head and cervical spine was performed following the standard protocol without intravenous contrast. Multiplanar CT image reconstructions of the cervical spine were also generated. COMPARISON:  CT brain 06/17/2015 FINDINGS: CT HEAD FINDINGS Brain: No acute territorial infarction or intracranial mass is visualized. Minimal 2 mm right parafalcine acute subdural hematoma with trace amount of adjacent subarachnoid hemorrhage. No significant mass effect. No midline shift. Asymmetric anterior volume loss as before. Stable ventricle size. Vascular: No hyperdense vessels.  Carotid vascular calcification Skull: Normal. Negative for fracture or focal lesion. Sinuses/Orbits: No acute finding. Other: None CT CERVICAL SPINE FINDINGS Alignment: No subluxation.  Facet alignment within normal limits Skull base and vertebrae: No fracture. Incomplete fusion posterior arch of C1. Soft tissues and spinal canal: No prevertebral fluid or swelling. No visible canal hematoma. Disc levels: Mild disc space narrowing and degenerative change C5-C6 and C6-C7. Mild facet degenerative changes at multiple levels. Upper chest: Negative. Other: None IMPRESSION: 1. Minimal 2 mm right parafalcine acute subdural hematoma with trace amount of adjacent subarachnoid hemorrhage. No significant mass effect or midline shift. 2. Mild degenerative changes of the cervical spine. No acute osseous abnormality. Critical Value/emergent results were called by telephone at the time of interpretation on 11/29/2020 at 6:11 pm to  provider JOSEPH ZAMMIT , who verbally acknowledged these results. Electronically Signed   By: Jasmine Pang M.D.   On: 11/29/2020 18:11    Procedures Procedures   Medications Ordered in ED Medications  oxyCODONE-acetaminophen (PERCOCET/ROXICET) 5-325 MG per tablet 1 tablet (1 tablet Oral Given 11/29/20 1912)    ED Course  I have reviewed the triage vital signs and the nursing notes.  Pertinent labs & imaging results that were available during my care of the patient were reviewed by me and considered in my medical decision making (see chart for details).    MDM Rules/Calculators/A&P                          This is a 65 year old female with a past medical history of hypothyroidism, hypertension, bipolar disorder, anxiety, who presents emergency department after a mechanical fall at home.  Patient was walking down her steep driveway when both her feet slipped out from underneath her causing her to fall backwards and strike the posterior aspect of her head on the driveway.  She denies loss of consciousness, had instant pain in the back of her head, no laceration or bleeding.  Patient is not on any anticoagulation.  She was able to get herself up with the help of her husband and had some associated nausea but no vomiting.  She presented to the ED approximately 9 hours prior to my evaluation and unfortunately had a prolonged waiting time.  When we are able to see her in the acute care bed, a CT of the head and cervical spine was obtained that showed a small subfalcine subdural hematoma with associated subarachnoid hemorrhage.  No traumatic injury of the cervical spine.  On exam she is afebrile, hemodynamically stable in no acute distress.  She has equal strength and sensation bilaterally upper and lower extremities.  She has no midline cervical, thoracic, or lumbar spine tenderness.  She has some mild right paraspinal thoracic tenderness.  No significant rib pain, no chest wall crepitus, chest is  stable to AP and lateral compression.  Pelvis is stable to AP and lateral compression.  She denies any numbness, weakness, vision changes.  She does endorse a mild posterior headache that has been present since her fall and is now worsening.  Her nausea that she was experiencing earlier has resolved.  Given the traumatic subdural, I spoke with neurosurgery, who recommends follow-up tomorrow in the office as her fall occurred approximately 9 hours earlier in the day and she has remained neurologically stable since then and appears well on my exam.  She was given the location and time of the neurosurgery office to be seen tomorrow at 1230, we discussed strict return precautions at length..   Final Clinical Impression(s) / ED Diagnoses Final diagnoses:  SDH (subdural hematoma) ([facility])    Rx / DC Orders ED Discharge Orders    None       Kathleen Lime, MD 11/29/20 2019    Jacalyn Lefevre, MD 11/29/20 2035

## 2020-11-30 DIAGNOSIS — I10 Essential (primary) hypertension: Secondary | ICD-10-CM | POA: Diagnosis not present

## 2020-11-30 DIAGNOSIS — Z6830 Body mass index (BMI) 30.0-30.9, adult: Secondary | ICD-10-CM | POA: Diagnosis not present

## 2020-11-30 DIAGNOSIS — S065X9A Traumatic subdural hemorrhage with loss of consciousness of unspecified duration, initial encounter: Secondary | ICD-10-CM | POA: Diagnosis not present

## 2020-12-01 ENCOUNTER — Other Ambulatory Visit: Payer: Self-pay | Admitting: Cardiovascular Disease

## 2020-12-09 DIAGNOSIS — Z85828 Personal history of other malignant neoplasm of skin: Secondary | ICD-10-CM | POA: Diagnosis not present

## 2020-12-09 DIAGNOSIS — L814 Other melanin hyperpigmentation: Secondary | ICD-10-CM | POA: Diagnosis not present

## 2020-12-09 DIAGNOSIS — L570 Actinic keratosis: Secondary | ICD-10-CM | POA: Diagnosis not present

## 2020-12-09 DIAGNOSIS — D1801 Hemangioma of skin and subcutaneous tissue: Secondary | ICD-10-CM | POA: Diagnosis not present

## 2021-02-08 DIAGNOSIS — G4709 Other insomnia: Secondary | ICD-10-CM | POA: Diagnosis not present

## 2021-02-08 DIAGNOSIS — G250 Essential tremor: Secondary | ICD-10-CM | POA: Diagnosis not present

## 2021-02-08 DIAGNOSIS — F3176 Bipolar disorder, in full remission, most recent episode depressed: Secondary | ICD-10-CM | POA: Diagnosis not present

## 2021-02-19 DIAGNOSIS — I10 Essential (primary) hypertension: Secondary | ICD-10-CM | POA: Diagnosis not present

## 2021-02-19 DIAGNOSIS — Z87891 Personal history of nicotine dependence: Secondary | ICD-10-CM | POA: Insufficient documentation

## 2021-02-19 DIAGNOSIS — R0602 Shortness of breath: Secondary | ICD-10-CM | POA: Diagnosis not present

## 2021-02-19 DIAGNOSIS — E039 Hypothyroidism, unspecified: Secondary | ICD-10-CM | POA: Insufficient documentation

## 2021-02-19 DIAGNOSIS — J988 Other specified respiratory disorders: Secondary | ICD-10-CM | POA: Insufficient documentation

## 2021-02-19 DIAGNOSIS — R059 Cough, unspecified: Secondary | ICD-10-CM | POA: Diagnosis not present

## 2021-02-19 DIAGNOSIS — J189 Pneumonia, unspecified organism: Secondary | ICD-10-CM | POA: Diagnosis not present

## 2021-02-19 DIAGNOSIS — R918 Other nonspecific abnormal finding of lung field: Secondary | ICD-10-CM | POA: Diagnosis not present

## 2021-02-19 NOTE — ED Triage Notes (Signed)
Pt was BIBA for SOB that started 30 minutes PTA. Pt states she woke up and "could not catch her breath". Pt also c/o green sputum cough. Upon EMS arrival when patient was walking to the truck her 02 sat dropped down to 88%. Pt dx with LLL PNA at UC today and sent home with a Z-pak and received a steroid shot.   99% RA, speaking in full sentences.

## 2021-02-20 ENCOUNTER — Emergency Department (HOSPITAL_BASED_OUTPATIENT_CLINIC_OR_DEPARTMENT_OTHER)
Admission: EM | Admit: 2021-02-20 | Discharge: 2021-02-20 | Disposition: A | Discharge status: Home / Self Care | Payer: BC Managed Care – PPO | Attending: Emergency Medicine | Admitting: Emergency Medicine

## 2021-02-20 DIAGNOSIS — J988 Other specified respiratory disorders: Secondary | ICD-10-CM

## 2021-02-20 NOTE — ED Notes (Signed)
After walking to room 02 was 97-99%.

## 2021-02-20 NOTE — ED Provider Notes (Signed)
[facility] Emergency Department Provider Note MRN:  [id number]  Arrival date & time: 02/20/21     Chief Complaint   Shortness of Breath and Cough   History of Present Illness   Kristina Tran is a 65 y.o. year-old female with a history of hypertension presenting to the ED with chief complaint of cough.  Patient received her second Covid booster shot earlier in the week.  She began to feel malaise, subjective fever 3 days ago.  Her husband it as well.  Husband recovered fully but she did not, she has continued to experience persistent productive cough.  Was seen at urgent care and diagnosed with pneumonia earlier today.  This evening she had an episode where she was sleeping flat and woke up and was having trouble moving air through her lungs.  After a few moments she was able to breathe and cough.  This was worrisome.  Denies any chest pain, no leg pain or swelling, no other complaints.  Review of Systems  A complete 10 system review of systems was obtained and all systems are negative except as noted in the HPI and PMH.   Patient's Health History    Past Medical History:  Diagnosis Date  . Bipolar 1 disorder (HCC)   . Chest pain   . History of echocardiogram    a. Echo 9/17: Mild LVH, EF 60-65, no RWMA, Gr 1 DD, trivial MR, normal RVSF, mild RAE  . History of nuclear stress test    a. Nuclear stress test 9/17: EF 75%, no ischemia or scar, normal  . Hypertension   . Hypothyroidism   . Leg edema     Past Surgical History:  Procedure Laterality Date  . BREAST BIOPSY    . BREAST EXCISIONAL BIOPSY Right    2000  . CARDIOVASCULAR STRESS TEST  07/27/2010   EF 74%. NORMAL  . US ECHOCARDIOGRAPHY  09/23/2004   EF 55-60%  . WISDOM TOOTH EXTRACTION      Family History  Problem Relation Age of Onset  . Breast cancer Mother   . Hypertension Mother   . Breast cancer Sister     Social History   Socioeconomic History  . Marital status: Married     Spouse name: Not on file  . Number of children: Not on file  . Years of education: Not on file  . Highest education level: Not on file  Occupational History  . Not on file  Tobacco Use  . Smoking status: Former Smoker    Quit date: 11/06/1981    Years since quitting: 39.3  . Smokeless tobacco: Former Engineer, water and Sexual Activity  . Alcohol use: No  . Drug use: No  . Sexual activity: Not on file  Other Topics Concern  . Not on file  Social History Narrative  . Not on file   Social Determinants of Health   Financial Resource Strain: Not on file  Food Insecurity: Not on file  Transportation Needs: Not on file  Physical Activity: Not on file  Stress: Not on file  Social Connections: Not on file  Intimate Partner Violence: Not on file     Physical Exam   Vitals:   02/19/21 2226 02/20/21 0055  BP: (!) 134/91 (!) 142/84  Pulse: 74 65  Resp: 19 19  Temp: 98.1 F (36.7 C)   SpO2: 99% 97%    CONSTITUTIONAL: Well-appearing, NAD NEURO:  Alert and oriented x 3, no focal deficits EYES:  eyes  equal and reactive ENT/NECK:  no LAD, no JVD CARDIO: Regular rate, well-perfused, normal S1 and S2 PULM:  CTAB no wheezing or rhonchi GI/GU:  normal bowel sounds, non-distended, non-tender MSK/SPINE:  No gross deformities, no edema SKIN:  no rash, atraumatic PSYCH:  Appropriate speech and behavior  *Additional and/or pertinent findings included in MDM below  Diagnostic and Interventional Summary    EKG Interpretation  Date/Time:    Ventricular Rate:    PR Interval:    QRS Duration:   QT Interval:    QTC Calculation:   R Axis:     Text Interpretation:        Labs Reviewed - No data to display  No orders to display    Medications - No data to display   Procedures  /  Critical Care Procedures  ED Course and Medical Decision Making  I have reviewed the triage vital signs, the nursing notes, and pertinent available records from the EMR.  Listed above are  laboratory and imaging tests that I personally ordered, reviewed, and interpreted and then considered in my medical decision making (see below for details).  Lungs are overall clear on my exam, a few faint scattered wheezes.  Patient is in no acute distress with normal vital signs, no desaturations with ambulation.  Clinically this is consistent with a respiratory infection and she is already taking Z-Pak.  Highly doubt PE or cardiopulmonary process.  Suspect patient mucous plug to or developed some atelectasis when laying flat.  Appropriate for discharge with reassurance.       Elmer Sow. Pilar Plate, MD [facility] Emergency Medicine [facility] [email or web address]  Final Clinical Impressions(s) / ED Diagnoses     ICD-10-CM   1. Respiratory infection  J98.8     ED Discharge Orders    None       Discharge Instructions Discussed with and Provided to Patient:     Discharge Instructions     You were evaluated in the Emergency Department and after careful evaluation, we did not find any emergent condition requiring admission or further testing in the hospital.  Your exam/testing today was overall reassuring.  We encourage you to complete the course of antibiotics to cover for pneumonia.  Recommend sleeping with a bit of an incline and drinking plenty of fluids and trying over-the-counter Mucinex.  Please return to the Emergency Department if you experience any worsening of your condition.  Thank you for allowing [place] to be a part of your care.       Sabas Sous, MD 02/20/21 [phone]

## 2021-02-20 NOTE — Discharge Instructions (Addendum)
You were evaluated in the Emergency Department and after careful evaluation, we did not find any emergent condition requiring admission or further testing in the hospital.  Your exam/testing today was overall reassuring.  We encourage you to complete the course of antibiotics to cover for pneumonia.  Recommend sleeping with a bit of an incline and drinking plenty of fluids and trying over-the-counter Mucinex.  Please return to the Emergency Department if you experience any worsening of your condition.  Thank you for allowing [place] to be a part of your care.

## 2021-02-25 DIAGNOSIS — Z85828 Personal history of other malignant neoplasm of skin: Secondary | ICD-10-CM | POA: Diagnosis not present

## 2021-02-25 DIAGNOSIS — L570 Actinic keratosis: Secondary | ICD-10-CM | POA: Diagnosis not present

## 2021-03-01 DIAGNOSIS — Z1231 Encounter for screening mammogram for malignant neoplasm of breast: Secondary | ICD-10-CM | POA: Diagnosis not present

## 2021-03-01 DIAGNOSIS — Z1239 Encounter for other screening for malignant neoplasm of breast: Secondary | ICD-10-CM | POA: Diagnosis not present

## 2021-03-14 DIAGNOSIS — M899 Disorder of bone, unspecified: Secondary | ICD-10-CM | POA: Diagnosis not present

## 2021-03-14 DIAGNOSIS — M7989 Other specified soft tissue disorders: Secondary | ICD-10-CM | POA: Diagnosis not present

## 2021-03-14 DIAGNOSIS — M79645 Pain in left finger(s): Secondary | ICD-10-CM | POA: Diagnosis not present

## 2021-03-16 ENCOUNTER — Ambulatory Visit: Payer: BC Managed Care – PPO | Admitting: Cardiovascular Disease

## 2021-03-16 DIAGNOSIS — D485 Neoplasm of uncertain behavior of skin: Secondary | ICD-10-CM | POA: Diagnosis not present

## 2021-03-22 DIAGNOSIS — M810 Age-related osteoporosis without current pathological fracture: Secondary | ICD-10-CM | POA: Diagnosis not present

## 2021-03-22 DIAGNOSIS — E785 Hyperlipidemia, unspecified: Secondary | ICD-10-CM | POA: Diagnosis not present

## 2021-03-22 DIAGNOSIS — F319 Bipolar disorder, unspecified: Secondary | ICD-10-CM | POA: Diagnosis not present

## 2021-03-22 DIAGNOSIS — E039 Hypothyroidism, unspecified: Secondary | ICD-10-CM | POA: Diagnosis not present

## 2021-03-23 ENCOUNTER — Other Ambulatory Visit: Payer: Self-pay

## 2021-03-23 ENCOUNTER — Telehealth: Payer: Self-pay | Admitting: Cardiovascular Disease

## 2021-03-23 MED ORDER — ROSUVASTATIN CALCIUM 20 MG PO TABS: ORAL_TABLET | ORAL | 0 refills | Status: DC

## 2021-03-23 NOTE — Telephone Encounter (Signed)
*  STAT* If patient is at the pharmacy, call can be transferred to refill team.   1. Which medications need to be refilled? (please list name of each medication and dose if known) rosuvastatin (CRESTOR) 20 MG tablet  2. Which pharmacy/location (including street and city if local pharmacy) is medication to be sent to? [id number] - [place], [place] - [address] AT [facility] OF [address]  3. Do they need a 30 day or 90 day supply? 90

## 2021-03-30 DIAGNOSIS — Z1389 Encounter for screening for other disorder: Secondary | ICD-10-CM | POA: Diagnosis not present

## 2021-03-30 DIAGNOSIS — Z1331 Encounter for screening for depression: Secondary | ICD-10-CM | POA: Diagnosis not present

## 2021-03-30 DIAGNOSIS — Z23 Encounter for immunization: Secondary | ICD-10-CM | POA: Diagnosis not present

## 2021-03-30 DIAGNOSIS — I129 Hypertensive chronic kidney disease with stage 1 through stage 4 chronic kidney disease, or unspecified chronic kidney disease: Secondary | ICD-10-CM | POA: Diagnosis not present

## 2021-03-30 DIAGNOSIS — Z0000 Encounter for general adult medical examination without abnormal findings: Secondary | ICD-10-CM | POA: Diagnosis not present

## 2021-05-21 ENCOUNTER — Encounter: Payer: Self-pay | Admitting: Cardiovascular Disease

## 2021-05-21 NOTE — Progress Notes (Signed)
Cardiology Office Note   Date:  05/23/2021   ID:  Donyae, Kohn 25-Feb-1956, MRN [id number]  PCP:  Melida Quitter, MD  Cardiologist:   Kristeen Miss, MD   Chief Complaint  Patient presents with   Hypertension             Kristina Tran is a 65 y.o. female who presents for follow up Hx of HTN, hyperlipidemia , hypothyroidsim, bipolar disease.  [name] has done well .  No episodes of CP ,  Dyspnea.  February 03, 2016:  Doing well.  Just got back from [place].   No CP or dyspnea.  Had some leg swelling bilaterally  Wore some compression hose which helped.  Her primary MD added HCTZ and decreased metoprolol to 1/2   January 29, 2017  [name] is doing well .  No CP or dyspnea.  No dizziness.  Not exercising much  Just got back form [place] recently .   March 04, 2018: [name] is seen today for follow-up of her hypertension and atypical chest pain.  Has a history of hyperlipidemia. Has bilateral achilies tendonitits Slowly improving Is on Lasix and HCTZ Discussed the [facility] Doctor leg rest.  Has gained some weight .  Playing golf  Looking forward to walking more   Mar 19, 2020: [name] is seen today for follow up of her HTN, HLD ,  and atypical CP  No cardiic issues.  Walks some, Plays golf regularly  Boxes once a week . Lipids are managed by Dr. Nadene Rubins [facility])  [name] were high. She loves mayonaise and eats quite a bit of mayonnaise on a weekly basis.  May 23, 2021: [name] is seen today for follow up of her HTN, HLD, atypical CP  Just get back from [place] ( hunting  trip)  Golf weekly ,  Encourage more cardio   Past Medical History:  Diagnosis Date   Bipolar 1 disorder (HCC)    Chest pain    History of echocardiogram    a. Echo 9/17: Mild LVH, EF 60-65, no RWMA, Gr 1 DD, trivial MR, normal RVSF, mild RAE   History of nuclear stress test    a. Nuclear stress test 9/17: EF 75%, no ischemia or scar, normal   Hypertension     Hypothyroidism    Leg edema     Past Surgical History:  Procedure Laterality Date   BREAST BIOPSY     BREAST EXCISIONAL BIOPSY Right    2000   CARDIOVASCULAR STRESS TEST  07/27/2010   EF 74%. NORMAL   US ECHOCARDIOGRAPHY  09/23/2004   EF 55-60%   WISDOM TOOTH EXTRACTION       Current Outpatient Medications  Medication Sig Dispense Refill   alendronate (FOSAMAX) 35 MG tablet Take 35 mg by mouth every 7 (seven) days. Take with a full glass of water on an empty stomach.     ARIPiprazole (ABILIFY) 5 MG tablet Take 5 mg by mouth daily.      buPROPion (WELLBUTRIN XL) 150 MG 24 hr tablet Take 150 mg by mouth daily.       cyclobenzaprine (FLEXERIL) 5 MG tablet cyclobenzaprine 5 mg tablet     divalproex (DEPAKOTE) 500 MG DR tablet Take 1,000 mg by mouth 2 (two) times daily.      hydrochlorothiazide (HYDRODIURIL) 25 MG tablet Take 25 mg by mouth daily.  1   levothyroxine (SYNTHROID, LEVOTHROID) 100 MCG tablet Take 100 mcg by mouth daily.  LORazepam (ATIVAN) 0.5 MG tablet Take 0.5 mg by mouth every 8 (eight) hours as needed for sleep.      propranolol (INDERAL) 40 MG tablet Take 40 mg by mouth daily.      rosuvastatin (CRESTOR) 20 MG tablet TAKE 1 TABLET(20 MG) BY MOUTH DAILY 60 tablet 0   traMADol (ULTRAM) 50 MG tablet Take 1 tablet (50 mg total) by mouth every 12 (twelve) hours as needed. for pain 30 tablet 0   Icosapent Ethyl (VASCEPA) 0.5 g CAPS Take 2 g by mouth 2 (two) times daily. 360 capsule 2   No current facility-administered medications for this visit.    Allergies:   Sulfa antibiotics, Sulfonamide derivatives, Limonene, and Penicillins    Social History:  The patient  reports that she quit smoking about 39 years ago. She has quit using smokeless tobacco. She reports that she does not drink alcohol and does not use drugs.   Family History:  The patient's family history includes Breast cancer in her mother and sister; Hypertension in her mother.    ROS: Noted in current  history, all other review of systems are negative.   Physical Exam: Blood pressure 126/80, pulse 76, height 5\' 7"  (1.702 m), weight 179 lb 9.6 oz (81.5 kg), SpO2 97 %.  GEN:  Well nourished, well developed in no acute distress HEENT: Normal NECK: No JVD; No carotid bruits LYMPHATICS: No lymphadenopathy CARDIAC: RRR , no murmurs, rubs, gallops RESPIRATORY:  Clear to auscultation without rales, wheezing or rhonchi  ABDOMEN: Soft, non-tender, non-distended MUSCULOSKELETAL:  No edema; No deformity  SKIN: Warm and dry NEUROLOGIC:  Alert and oriented x 3  EKG: May 23, 2021: Normal sinus rhythm at 76.  No ST or T wave changes.   Recent Labs: No results found for requested labs within last 8760 hours.    Lipid Panel    Component Value Date/Time   CHOL 153 11/25/2013 1037   TRIG 118.0 11/25/2013 1037   HDL 63.60 11/25/2013 1037   CHOLHDL 2 11/25/2013 1037   VLDL 23.6 11/25/2013 1037   LDLCALC 66 11/25/2013 1037   LDLDIRECT 135.5 10/02/2012 0936      Wt Readings from Last 3 Encounters:  05/23/21 179 lb 9.6 oz (81.5 kg)  02/19/21 173 lb 4.5 oz (78.6 kg)  11/29/20 185 lb (83.9 kg)      Other studies Reviewed: Additional studies/ records that were reviewed today include: . Review of the above records demonstrates:    ASSESSMENT AND PLAN:  1.  HTN:   Blood pressure is well controlled.  Continue current medication.  I encouraged her to work on diet, exercise, weight loss program.   2. Hyperlipidemia.  Her triglycerides and her LDL remain elevated.  We will start her on Vascepa 2 g p.o. twice daily.  I would like to refer her to the lipid clinic.  She may benefit from being on a PCSK9 inhibitor.  We will recheck labs in 3 months.   Current medicines are reviewed at length with the patient today.  The patient does not have concerns regarding medicines.  The following changes have been made:  no change  Labs/ tests ordered today include:   Orders Placed This Encounter   Procedures   Basic metabolic panel   Hepatic function panel   Lipid panel   AMB Referral to [facility] Pharm-[facility]   EKG 12-Lead       Signed, [facility], MD  05/23/2021 5:19 PM    [facility]  [place], [place], [place]  [address] Phone: [phone]; Fax: [phone]

## 2021-05-23 ENCOUNTER — Ambulatory Visit (INDEPENDENT_AMBULATORY_CARE_PROVIDER_SITE_OTHER): Payer: BC Managed Care – PPO | Admitting: Cardiovascular Disease

## 2021-05-23 ENCOUNTER — Encounter: Payer: Self-pay | Admitting: Cardiovascular Disease

## 2021-05-23 ENCOUNTER — Other Ambulatory Visit: Payer: Self-pay

## 2021-05-23 VITALS — BP 126/80 | HR 76 | Ht 67.0 in | Wt 179.6 lb

## 2021-05-23 DIAGNOSIS — E782 Mixed hyperlipidemia: Secondary | ICD-10-CM

## 2021-05-23 DIAGNOSIS — I10 Essential (primary) hypertension: Secondary | ICD-10-CM

## 2021-05-23 MED ORDER — VASCEPA 0.5 G PO CAPS: 2.0000 g | ORAL_CAPSULE | Freq: Two times a day (BID) | ORAL | 3 refills | Status: DC

## 2021-05-23 MED ORDER — VASCEPA 0.5 G PO CAPS: 2.0000 g | ORAL_CAPSULE | Freq: Two times a day (BID) | ORAL | 2 refills | Status: DC

## 2021-05-23 NOTE — Patient Instructions (Addendum)
Medication Instructions:  Your physician has recommended you make the following change in your medication:   Begin Vacepa, 2g (4 capsules), 2 times per day   Labwork: Your physician recommends that you return for lab work in:   Fasting lipids, ALT, BMP in 3 months   Testing/Procedures: None ordered.  Follow-Up:  A referral to our pharmacy has been made to assist in lipid management.   August 8 for lab work. Please come fasting. You may come anytime between 8am and 4:30pm  12 months with Dr. Elease Hashimoto  Any Other Special Instructions Will Be Listed Below (If Applicable).     If you need a refill on your cardiac medications before your next appointment, please call your pharmacy.

## 2021-05-24 ENCOUNTER — Telehealth: Payer: Self-pay | Admitting: Cardiovascular Disease

## 2021-05-24 DIAGNOSIS — I10 Essential (primary) hypertension: Secondary | ICD-10-CM

## 2021-05-24 DIAGNOSIS — E782 Mixed hyperlipidemia: Secondary | ICD-10-CM

## 2021-05-24 MED ORDER — ICOSAPENT ETHYL 1 G PO CAPS: 2.0000 g | ORAL_CAPSULE | Freq: Two times a day (BID) | ORAL | 1 refills | Status: DC

## 2021-05-24 NOTE — Telephone Encounter (Signed)
New Message:    Pharmacist called. They need clarification of directions for patient's Vascepa.

## 2021-05-24 NOTE — Telephone Encounter (Signed)
[facility] Pharmacist given clarification:   We will start her on Vascepa 2 g p.o. twice daily. (From 05/23/21 OV)   They will dispense 1 gram caps instead of the 0.5 gram caps and it will be fine with her Insurance per the Pharmacist.

## 2021-05-26 ENCOUNTER — Telehealth: Payer: Self-pay | Admitting: Cardiovascular Disease

## 2021-05-26 DIAGNOSIS — M810 Age-related osteoporosis without current pathological fracture: Secondary | ICD-10-CM | POA: Diagnosis not present

## 2021-05-26 NOTE — Telephone Encounter (Signed)
Called patient and advised her that the prescribed dose of Vascepa is 2 g twice daily. She verbalized understanding and states the pharmacy is waiting for a PA. I advised that our office will complete the PA and that she should expect to hear back from her pharmacy in a few days or early next week. She verbalized understanding and agreement and thanked me for the call.

## 2021-05-26 NOTE — Telephone Encounter (Signed)
Pt c/o medication issue:  1. Name of Medication:  icosapent Ethyl (VASCEPA) 1 g capsule  2. How are you currently taking this medication (dosage and times per day)? N/A  3. Are you having a reaction (difficulty breathing--STAT)? N/A 4. What is your medication issue? Pharmacy called stating pt was confused by the prescription due to the pharmacy receiving it as "1g" but the AVS stating "2g". Pharmacy states they are also needing PA in regards to this. Please advise.

## 2021-05-27 NOTE — Telephone Encounter (Signed)
**Note De-Identified Zayd Bonet Obfuscation** I started a Vascepa PA through covermymeds. Key: [id number]

## 2021-05-31 NOTE — Telephone Encounter (Signed)
**Note De-Identified Eulia Hatcher Obfuscation** Annlee Jerilynn Birkenhead (Key: [id number]) Vascepa 1GM capsules  Form: Consulting civil engineer Form (CB) Determination: Favorable Prior authorization for Vascepa has been approved. Message from plan: Effective from 05/27/2021 through 05/26/2022.

## 2021-06-06 ENCOUNTER — Other Ambulatory Visit (HOSPITAL_COMMUNITY): Payer: Self-pay | Admitting: *Deleted

## 2021-06-07 ENCOUNTER — Other Ambulatory Visit: Payer: Self-pay

## 2021-06-07 ENCOUNTER — Ambulatory Visit (HOSPITAL_COMMUNITY)
Admission: RE | Admit: 2021-06-07 | Discharge: 2021-06-07 | Disposition: A | Discharge status: Home / Self Care | Payer: BC Managed Care – PPO | Source: Ambulatory Visit | Attending: Internal Medicine | Admitting: Internal Medicine

## 2021-06-07 DIAGNOSIS — M810 Age-related osteoporosis without current pathological fracture: Secondary | ICD-10-CM | POA: Insufficient documentation

## 2021-06-07 DIAGNOSIS — F3176 Bipolar disorder, in full remission, most recent episode depressed: Secondary | ICD-10-CM | POA: Diagnosis not present

## 2021-06-07 MED ORDER — ZOLEDRONIC ACID 5 MG/100ML IV SOLN: 5.0000 mg | Freq: Once | INTRAVENOUS | Status: DC

## 2021-06-07 MED ORDER — ZOLEDRONIC ACID 5 MG/100ML IV SOLN
INTRAVENOUS | Status: AC
  Administered 2021-06-07: 5 mg
  Filled 2021-06-07: qty 100

## 2021-06-13 ENCOUNTER — Other Ambulatory Visit: Payer: BC Managed Care – PPO | Admitting: *Deleted

## 2021-06-13 ENCOUNTER — Other Ambulatory Visit: Payer: Self-pay

## 2021-06-13 ENCOUNTER — Ambulatory Visit (INDEPENDENT_AMBULATORY_CARE_PROVIDER_SITE_OTHER): Payer: BC Managed Care – PPO | Admitting: Pharmacist

## 2021-06-13 DIAGNOSIS — E782 Mixed hyperlipidemia: Secondary | ICD-10-CM

## 2021-06-13 DIAGNOSIS — I10 Essential (primary) hypertension: Secondary | ICD-10-CM | POA: Diagnosis not present

## 2021-06-13 LAB — BASIC METABOLIC PANEL
BUN/Creatinine Ratio: 14 (ref 12–28)
BUN: 22 mg/dL (ref 8–27)
CO2: 24 mmol/L (ref 20–29)
Calcium: 9.3 mg/dL (ref 8.7–10.3)
Chloride: 99 mmol/L (ref 96–106)
Creatinine, Ser: 1.59 mg/dL — ABNORMAL HIGH (ref 0.57–1.00)
Glucose: 92 mg/dL (ref 65–99)
Potassium: 3.6 mmol/L (ref 3.5–5.2)
Sodium: 141 mmol/L (ref 134–144)
eGFR: 36 mL/min/{1.73_m2} — ABNORMAL LOW (ref >59)

## 2021-06-13 LAB — LIPID PANEL
Chol/HDL Ratio: 4 ratio (ref 0.0–4.4)
Cholesterol, Total: 219 mg/dL — ABNORMAL HIGH (ref 100–199)
HDL: 55 mg/dL (ref >39)
LDL Chol Calc (NIH): 124 mg/dL — ABNORMAL HIGH (ref 0–99)
Triglycerides: 227 mg/dL — ABNORMAL HIGH (ref 0–149)
VLDL Cholesterol Cal: 40 mg/dL (ref 5–40)

## 2021-06-13 LAB — HEPATIC FUNCTION PANEL
ALT: 16 IU/L (ref 0–32)
AST: 19 IU/L (ref 0–40)
Albumin: 4.3 g/dL (ref 3.8–4.8)
Alkaline Phosphatase: 58 IU/L (ref 44–121)
Bilirubin Total: 0.4 mg/dL (ref 0.0–1.2)
Bilirubin, Direct: 0.12 mg/dL (ref 0.00–0.40)
Total Protein: 7 g/dL (ref 6.0–8.5)

## 2021-06-13 NOTE — Progress Notes (Signed)
Patient ID: Kristina Tran                 DOB: [birth year]                    MRN: [id number]     HPI: Kristina Tran is a 65 y.o. female patient referred to lipid clinic by Dr. Elease Hashimoto. PMH is significant for HTN, hyperlipidemia , hypothyroidsim and bipolar disease. At last visit with Dr. Elease Hashimoto, patient was started on Vascepa 2g BID for elevated TG. She was referred to lipid clinic for consideration of PCSK9i.  Patient presents today to lipid clinic. She states that she does not want an injection. She really doesn't want any more medications. She has started to walk about 2 miles 5 days a week. She is taking the Vascepa 2g BID. Labs drawn prior to appointment, however she has only been taking for about 2 weeks. She starts medicare Sept 1. She golf's but use a cart. Does minimal strength training. States she thinks her biggest diet issue is mayonnaise.  Thinks maybe her TG issues is genetic as her mother had very high TG.   Current Medications: rosuvastatin 20mg  daily, icosapent ethyl 2g BID Intolerances: none Risk Factors: HTN LDL goal: <100  Diet: breakfast: egg or cereal (special K or shredded wheat) Lunch: tomato sandwich, greek salad with oil vinegar w/ feta cheese, sandwich or salad Dinner:  chicken, shrimp, lobster, white fish (hallibit) doesn't love salmon, tomatoes, peas, squash, lima beans, corn, loves vegetables, loves white rice but doesn't eat much, makes vegetable soup Snack: doesn't snack Drink: water, milk occassionally, tea occasionally, ginger ale or coke only when she is real thirsty, 1 drink on Fri and Sat. Sometimes a beer after golf  Exercise: starting to walk more, plays golf (rides in cart)  Family History: The patient's family history includes Breast cancer in her mother and sister; Hypertension in her mother.   Social History: The patient  reports that she quit smoking about 39 years ago. She has quit using smokeless tobacco. She reports that she does  not drink alcohol and does not use drugs.   Labs: 03/22/21 TC 246, HDL 47, LDL 130, TG 345  Past Medical History:  Diagnosis Date   Bipolar 1 disorder (HCC)    Chest pain    History of echocardiogram    a. Echo 9/17: Mild LVH, EF 60-65, no RWMA, Gr 1 DD, trivial MR, normal RVSF, mild RAE   History of nuclear stress test    a. Nuclear stress test 9/17: EF 75%, no ischemia or scar, normal   Hypertension    Hypothyroidism    Leg edema     Current Outpatient Medications on File Prior to Visit  Medication Sig Dispense Refill   alendronate (FOSAMAX) 35 MG tablet Take 35 mg by mouth every 7 (seven) days. Take with a full glass of water on an empty stomach.     ARIPiprazole (ABILIFY) 5 MG tablet Take 5 mg by mouth daily.      buPROPion (WELLBUTRIN XL) 150 MG 24 hr tablet Take 150 mg by mouth daily.       cyclobenzaprine (FLEXERIL) 5 MG tablet cyclobenzaprine 5 mg tablet     divalproex (DEPAKOTE) 500 MG DR tablet Take 1,000 mg by mouth 2 (two) times daily.      hydrochlorothiazide (HYDRODIURIL) 25 MG tablet Take 25 mg by mouth daily.  1   icosapent Ethyl (VASCEPA) 1 g capsule Take 2 capsules (2  g total) by mouth 2 (two) times daily. 360 capsule 1   levothyroxine (SYNTHROID, LEVOTHROID) 100 MCG tablet Take 100 mcg by mouth daily.       LORazepam (ATIVAN) 0.5 MG tablet Take 0.5 mg by mouth every 8 (eight) hours as needed for sleep.      propranolol (INDERAL) 40 MG tablet Take 40 mg by mouth daily.      rosuvastatin (CRESTOR) 20 MG tablet TAKE 1 TABLET(20 MG) BY MOUTH DAILY 60 tablet 0   traMADol (ULTRAM) 50 MG tablet Take 1 tablet (50 mg total) by mouth every 12 (twelve) hours as needed. for pain 30 tablet 0   No current facility-administered medications on file prior to visit.    Allergies  Allergen Reactions   Sulfa Antibiotics Other (See Comments)   Sulfonamide Derivatives     REACTION: Vomiting   Limonene Rash    REACTION: Vomiting   Penicillins Rash    Did it involve swelling of  the face/tongue/throat, SOB, or low BP? No Did it involve sudden or severe rash/hives, skin peeling, or any reaction on the inside of your mouth or nose? Yes Did you need to seek medical attention at a hospital or doctor's office? No When did it last happen?   more than 10 years    If all above answers are "NO", may proceed with cephalosporin use.     Assessment/Plan:  1. Hyperlipidemia - LDL and TG are above goal of <100 and <150 respectively. Updated labs pending. Patient has only been on Vascepa 2 weeks. We did discuss PCSK9i and ezetimibe. However, patient does not wish to add any medications at this time. She wants to work on diet and exercise. I did explain that although I do think diet and exercise and the most important things in a healthy lifestyle that if she truly have a genetic component to her dyslipidemia, she may need medication. Diet and exercise reviewed in detail. Recommended strength training to help build muscle, increase bone density and prevent falls. Will recheck labs in 3 months and reassess. Continue rosuvastatin 20mg  daily and Vascepa 2g BID. Recheck labs 11/7.   Thank you,   13/7, Pharm.D, BCPS, CPP [facility]  [address], [place], [place]  Phone: [phone]; Fax: [phone]

## 2021-06-13 NOTE — Patient Instructions (Addendum)
Bread- look for the lowest carb to fiber ratio Cut back on cereal, bread, soda Increase exercise, work on muscle tone/strength  Recheck labs 11/7 - these should be fasting. You can come to lab between 7:30-4:30.  Call me at [phone] with any questions

## 2021-06-15 ENCOUNTER — Telehealth: Payer: Self-pay | Admitting: Cardiovascular Disease

## 2021-06-15 DIAGNOSIS — E782 Mixed hyperlipidemia: Secondary | ICD-10-CM

## 2021-06-15 NOTE — Telephone Encounter (Signed)
Pt called in returning Shameas called about results.   (Fyi , call droped,  as I had phone issue sand had to restart computer)

## 2021-06-15 NOTE — Telephone Encounter (Signed)
Kristina Mixer, MD  06/14/2021  5:02 PM EDT      Cholesterol remains elevated despite multiple medications  Please set her up to see lipid  clinic   Left message for patient to call back. Referral has been placed.

## 2021-06-15 NOTE — Telephone Encounter (Signed)
Patient is calling back for results, she ask that she be text since she is missing calls. Please advise

## 2021-06-15 NOTE — Telephone Encounter (Signed)
Left message for patient with results. Have also sent a message through MyChart.

## 2021-06-17 ENCOUNTER — Telehealth: Payer: Self-pay | Admitting: Cardiovascular Disease

## 2021-06-17 NOTE — Telephone Encounter (Signed)
Kristina Tran called me back since I had called her to schedule Lipid clinic appt. Patient wanted to know why she was being referred and I read to her the office note.   Patient told me that she had gotten her medication late that she was supposed to have started taking after her OV on 05/23/21 due a pharmacy issue. She said was about 7-10 days late on starting it when she had her fasting labs done on 06/13/21. Wanted to know if that would have any affect on the labs results and was hoping to wait on this referral to see if the medication would help with her cholesterol. Please advise.

## 2021-06-17 NOTE — Telephone Encounter (Signed)
Patient was seen in the lipid clinic on 8/8. She refused PCSK9i. She was set up for repeat lipid panel in Nov to reassess.

## 2021-08-10 ENCOUNTER — Other Ambulatory Visit: Payer: Self-pay

## 2021-08-10 MED ORDER — ROSUVASTATIN CALCIUM 20 MG PO TABS: ORAL_TABLET | ORAL | 9 refills | Status: DC

## 2021-09-12 ENCOUNTER — Other Ambulatory Visit: Payer: Medicare Other | Admitting: *Deleted

## 2021-09-12 ENCOUNTER — Other Ambulatory Visit: Payer: Self-pay

## 2021-09-12 DIAGNOSIS — E782 Mixed hyperlipidemia: Secondary | ICD-10-CM

## 2021-09-12 LAB — LIPID PANEL
Chol/HDL Ratio: 4.6 ratio — ABNORMAL HIGH (ref 0.0–4.4)
Cholesterol, Total: 217 mg/dL — ABNORMAL HIGH (ref 100–199)
HDL: 47 mg/dL (ref >39)
LDL Chol Calc (NIH): 108 mg/dL — ABNORMAL HIGH (ref 0–99)
Triglycerides: 366 mg/dL — ABNORMAL HIGH (ref 0–149)
VLDL Cholesterol Cal: 62 mg/dL — ABNORMAL HIGH (ref 5–40)

## 2021-09-13 ENCOUNTER — Telehealth: Payer: Self-pay

## 2021-09-13 NOTE — Telephone Encounter (Signed)
Spoke with pt who reports she is taking medications as prescribed.  Reviewed lab results per Dr Elease Hashimoto and advised to continue with better diet, exercise and weight loss efforts.  Pt verbalizes understanding and agrees with current plan.

## 2021-09-13 NOTE — Telephone Encounter (Signed)
-----   Message from Vesta Mixer, MD sent at 09/13/2021  9:12 AM EST ----- LDL remains mildly elevated - is she still on her rosuvastatin ? Trigs are very elevated.   Is she still taking her Vascepa ? Cont with better diet, exercise , weight loss efforts.

## 2021-10-18 ENCOUNTER — Ambulatory Visit (INDEPENDENT_AMBULATORY_CARE_PROVIDER_SITE_OTHER): Payer: Medicare Other | Admitting: Orthopaedic Surgery

## 2021-10-18 ENCOUNTER — Encounter: Payer: Self-pay | Admitting: Orthopaedic Surgery

## 2021-10-18 ENCOUNTER — Ambulatory Visit (INDEPENDENT_AMBULATORY_CARE_PROVIDER_SITE_OTHER): Payer: Medicare Other

## 2021-10-18 ENCOUNTER — Other Ambulatory Visit: Payer: Self-pay

## 2021-10-18 VITALS — BP 120/83 | HR 71 | Ht 66.75 in | Wt 177.0 lb

## 2021-10-18 DIAGNOSIS — M76892 Other specified enthesopathies of left lower limb, excluding foot: Secondary | ICD-10-CM | POA: Diagnosis not present

## 2021-10-18 DIAGNOSIS — M25562 Pain in left knee: Secondary | ICD-10-CM | POA: Diagnosis not present

## 2021-10-18 DIAGNOSIS — M4310 Spondylolisthesis, site unspecified: Secondary | ICD-10-CM | POA: Diagnosis not present

## 2021-10-18 NOTE — Progress Notes (Signed)
Office Visit Note   Patient: Kristina Tran           Date of Birth: September 11, 1956           MRN: [id number] Visit Date: 10/18/2021              Requested by: Melida Quitter, MD [address] [place],  [place] [address] PCP: Melida Quitter, MD   Assessment & Plan: Visit Diagnoses:  1. Acute pain of left knee   2. Popliteus tendinitis of left lower extremity   3. Degenerative spondylolisthesis     Plan: Discussed with him not sure this is related to some lateral hamstring tendinitis or popliteus tendinitis of the left knee.  Some of her pain may be related to her degenerative anterolisthesis although she has no nerve root tension signs on today's exam.  We will set her up for short course of physical therapy check her after the holidays.  Follow-Up Instructions: Return in about 6 weeks (around 11/29/2021).   Orders:  Orders Placed This Encounter  Procedures   XR KNEE 3 VIEW LEFT   No orders of the defined types were placed in this encounter.     Procedures: No procedures performed   Clinical Data: No additional findings.   Subjective: Chief Complaint  Patient presents with   Left Knee - Pain    HPI 65 year old female seen with left knee pain x3 months.  She has not played any golf and does not recall any specific injury.  She has been seen in the past for degenerative spondylolisthesis L4-5 3 mm.  Occasionally she has noticed some pain in the lateral calf but primarily adjacent to the lateral hamstring behind the knee.  She has not noticed any fullness or swelling behind the knee.  No swelling anteriorly.  Problems getting up from a low seat some discomfort with turning or twisting.  Review of Systems all other systems noncontributory HPI.   Objective: Vital Signs: BP 120/83    Pulse 71    Ht 5' 6.75" (1.695 m)    Wt 177 lb (80.3 kg)    BMI 27.93 kg/m   Physical Exam Constitutional:      Appearance: She is well-developed.  HENT:     Head: Normocephalic.      Right Ear: External ear normal.     Left Ear: External ear normal. There is no impacted cerumen.  Eyes:     Pupils: Pupils are equal, round, and reactive to light.  Neck:     Thyroid: No thyromegaly.     Trachea: No tracheal deviation.  Cardiovascular:     Rate and Rhythm: Normal rate.  Pulmonary:     Effort: Pulmonary effort is normal.  Abdominal:     Palpations: Abdomen is soft.  Musculoskeletal:     Cervical back: No rigidity.  Skin:    General: Skin is warm and dry.  Neurological:     Mental Status: She is alert and oriented to person, place, and time.  Psychiatric:        Behavior: Behavior normal.    Ortho Exam patient uses her hands get sitting and standing.  No sciatic notch tenderness negative logroll hips right and left.  No palpable Baker's cyst.  Tenderness over the lateral hamstring no tenderness over the peroneal nerve at the fibular neck.  Anterior tib gastrocsoleus is normal.  Negative popliteal compression test.  Minimal tenderness over the medial hamstring some discomfort with resisted knee flexion at the  lateral hamstring and popliteus.  ACL PCL exam is normal negative Lachman.  Distal pulses are palpable.  Specialty Comments:  No specialty comments available.  Imaging: XR KNEE 3 VIEW LEFT  Result Date: 10/18/2021 Three-view x-rays left knee obtained including standing AP both knees lateral left knee and sunrise patella view.  This shows normal anatomy no significant degenerative changes no effusion. Impression: Left knee negative for acute or chronic degenerative changes.    PMFS History: Patient Active Problem List   Diagnosis Date Noted   Popliteus tendinitis of left lower extremity 10/18/2021   Degenerative spondylolisthesis 10/18/2021   Trochanteric bursitis, left hip 03/18/2020   Hyperlipidemia 11/25/2013   Bipolar affective disorder (HCC) 10/02/2012   HTN (hypertension) 09/22/2011   Hypothyroidism 09/22/2011   Past Medical History:  Diagnosis  Date   Bipolar 1 disorder (HCC)    Chest pain    History of echocardiogram    a. Echo 9/17: Mild LVH, EF 60-65, no RWMA, Gr 1 DD, trivial MR, normal RVSF, mild RAE   History of nuclear stress test    a. Nuclear stress test 9/17: EF 75%, no ischemia or scar, normal   Hypertension    Hypothyroidism    Leg edema     Family History  Problem Relation Age of Onset   Breast cancer Mother    Hypertension Mother    Breast cancer Sister     Past Surgical History:  Procedure Laterality Date   BREAST BIOPSY     BREAST EXCISIONAL BIOPSY Right    2000   CARDIOVASCULAR STRESS TEST  07/27/2010   EF 74%. NORMAL   US ECHOCARDIOGRAPHY  09/23/2004   EF 55-60%   WISDOM TOOTH EXTRACTION     Social History   Occupational History   Not on file  Tobacco Use   Smoking status: Former    Types: Cigarettes    Quit date: 11/06/1981    Years since quitting: 39.9   Smokeless tobacco: Former  Substance and Sexual Activity   Alcohol use: No   Drug use: No   Sexual activity: Not on file

## 2021-10-18 NOTE — Addendum Note (Signed)
Addended by: Rogers Seeds on: 10/18/2021 02:34 PM   Modules accepted: Orders

## 2021-11-10 ENCOUNTER — Ambulatory Visit: Payer: Medicare Other | Admitting: Rehabilitative and Restorative Service Providers"

## 2021-11-20 ENCOUNTER — Other Ambulatory Visit: Payer: Self-pay | Admitting: Cardiovascular Disease

## 2021-11-21 ENCOUNTER — Other Ambulatory Visit: Payer: Self-pay | Admitting: Cardiovascular Disease

## 2021-11-22 MED ORDER — ICOSAPENT ETHYL 1 G PO CAPS: 2.0000 g | ORAL_CAPSULE | Freq: Two times a day (BID) | ORAL | 1 refills | Status: DC

## 2022-01-30 ENCOUNTER — Other Ambulatory Visit: Payer: Self-pay | Admitting: Orthopaedic Surgery

## 2022-01-30 ENCOUNTER — Telehealth: Payer: Self-pay

## 2022-01-30 MED ORDER — TRAMADOL HCL 50 MG PO TABS
50.0000 mg | ORAL_TABLET | Freq: Two times a day (BID) | ORAL | 0 refills | Status: DC | PRN
Start: 2022-01-30 — End: 2022-09-29

## 2022-01-30 NOTE — Telephone Encounter (Signed)
Please advise 

## 2022-01-30 NOTE — Telephone Encounter (Signed)
I called patient and advised. 

## 2022-01-30 NOTE — Telephone Encounter (Signed)
Patient called and would like a refill on her tramadol .  ? ?Please advise  ?

## 2022-02-22 ENCOUNTER — Encounter: Payer: Self-pay | Admitting: Orthopaedic Surgery

## 2022-02-22 ENCOUNTER — Ambulatory Visit (INDEPENDENT_AMBULATORY_CARE_PROVIDER_SITE_OTHER): Payer: Medicare Other | Admitting: Orthopaedic Surgery

## 2022-02-22 DIAGNOSIS — S93602A Unspecified sprain of left foot, initial encounter: Secondary | ICD-10-CM

## 2022-02-22 NOTE — Progress Notes (Signed)
? ?Office Visit Note ?  ?Patient: Kristina Tran [facility]           ?Date of Birth: 06-21-1956           ?MRN: [id number] ?Visit Date: 02/22/2022 ?             ?Requested by: Melida Quitter, MD ?[address] ?[place],  [place] [address] ?PCP: Melida Quitter, MD ? ? ?Assessment & Plan: ?Visit Diagnoses:  ?1. Foot sprain, left, initial encounter   ? ? ?Plan: Patient has left foot sprain.  Might of been unrecognized trauma or strain while she was playing.  She wore the same shoe she normally wears.  She can continue with elevation add in some Aleve 2 p.o. daily.  She did notice some improvement in the last several days.  If she has persistent problems she can return for radiographs of her left foot.  ? ?Follow-Up Instructions: No follow-ups on file.  ? ?Orders:  ?No orders of the defined types were placed in this encounter. ? ?No orders of the defined types were placed in this encounter. ? ? ? ? Procedures: ?No procedures performed ? ? ?Clinical Data: ?No additional findings. ? ? ?Subjective: ?Chief Complaint  ?Patient presents with  ? Left Foot - Pain  ? Left Ankle - Pain  ? ? ?HPI [name] returns she 65 point golf last Tuesday played 18 holes easily replace 9.  She got down and then had sharp pain on the dorsum of her foot and then developed some ecchymosis and slight erythema.  No history of gout.  No problems with the opposite right foot.  Symptoms have been at the metatarsal tarsal joint junction with mild swelling noted.  She has had pain with toe flexion and extension and has been amatory with a limp. ? ?Review of Systems all other systems updated noncontributory to HPI. ? ? ?Objective: ?Vital Signs: BP 119/81   Pulse 93   Ht 5' 6.75" (1.695 m)   Wt 179 lb (81.2 kg)   BMI 28.25 kg/m?  ? ?Physical Exam ?Constitutional:   ?   Appearance: She is well-developed.  ?HENT:  ?   Head: Normocephalic.  ?   Right Ear: External ear normal.  ?   Left Ear: External ear normal. There is no impacted cerumen.  ?Eyes:  ?   Pupils:  Pupils are equal, round, and reactive to light.  ?Neck:  ?   Thyroid: No thyromegaly.  ?   Trachea: No tracheal deviation.  ?Cardiovascular:  ?   Rate and Rhythm: Normal rate.  ?Pulmonary:  ?   Effort: Pulmonary effort is normal.  ?Abdominal:  ?   Palpations: Abdomen is soft.  ?Musculoskeletal:  ?   Cervical back: No rigidity.  ?Skin: ?   General: Skin is warm and dry.  ?Neurological:  ?   Mental Status: She is alert and oriented to person, place, and time.  ?Psychiatric:     ?   Behavior: Behavior normal.  ? ? ?Ortho Exam patient has a unilateral left foot dorsal swelling.  Opposite right foot shows full range of motion.  She has some discomfort with toe flexion extension great toe and other toes.  Some tenderness over the dorsal tenosynovium.  Resolving ecchymosis is noted with tenderness to maximize the tarsometatarsal joint.  Posterior tib anterior tib is active.  Gastrocsoleus is normal no tenderness over the plantar surface of the foot.  No plantar foot lesions. ? ?Specialty Comments:  ?No specialty comments available. ? ?  Imaging: ?No results found. ? ? ?PMFS History: ?Patient Active Problem List  ? Diagnosis Date Noted  ? Foot sprain, left, initial encounter 02/22/2022  ? Popliteus tendinitis of left lower extremity 10/18/2021  ? Degenerative spondylolisthesis 10/18/2021  ? Trochanteric bursitis, left hip 03/18/2020  ? Hyperlipidemia 11/25/2013  ? Bipolar affective disorder (HCC) 10/02/2012  ? HTN (hypertension) 09/22/2011  ? Hypothyroidism 09/22/2011  ? ?Past Medical History:  ?Diagnosis Date  ? Bipolar 1 disorder (HCC)   ? Chest pain   ? History of echocardiogram   ? a. Echo 9/17: Mild LVH, EF 60-65, no RWMA, Gr 1 DD, trivial MR, normal RVSF, mild RAE  ? History of nuclear stress test   ? a. Nuclear stress test 9/17: EF 75%, no ischemia or scar, normal  ? Hypertension   ? Hypothyroidism   ? Leg edema   ?  ?Family History  ?Problem Relation Age of Onset  ? Breast cancer Mother   ? Hypertension Mother   ?  Breast cancer Sister   ?  ?Past Surgical History:  ?Procedure Laterality Date  ? BREAST BIOPSY    ? BREAST EXCISIONAL BIOPSY Right   ? 2000  ? CARDIOVASCULAR STRESS TEST  07/27/2010  ? EF 74%. NORMAL  ? US ECHOCARDIOGRAPHY  09/23/2004  ? EF 55-60%  ? WISDOM TOOTH EXTRACTION    ? ?Social History  ? ?Occupational History  ? Not on file  ?Tobacco Use  ? Smoking status: Former  ?  Types: Cigarettes  ?  Quit date: 11/06/1981  ?  Years since quitting: 40.3  ? Smokeless tobacco: Former  ?Substance and Sexual Activity  ? Alcohol use: No  ? Drug use: No  ? Sexual activity: Not on file  ? ? ? ? ? ? ?

## 2022-03-07 ENCOUNTER — Other Ambulatory Visit: Payer: Self-pay

## 2022-03-07 ENCOUNTER — Emergency Department (HOSPITAL_COMMUNITY)
Admission: EM | Admit: 2022-03-07 | Discharge: 2022-03-08 | Disposition: A | Discharge status: Home / Self Care | Payer: Medicare Other | Attending: Emergency Medicine | Admitting: Emergency Medicine

## 2022-03-07 ENCOUNTER — Encounter (HOSPITAL_COMMUNITY): Payer: Self-pay | Admitting: Emergency Medicine

## 2022-03-07 DIAGNOSIS — E039 Hypothyroidism, unspecified: Secondary | ICD-10-CM | POA: Insufficient documentation

## 2022-03-07 DIAGNOSIS — I10 Essential (primary) hypertension: Secondary | ICD-10-CM | POA: Insufficient documentation

## 2022-03-07 DIAGNOSIS — Z79899 Other long term (current) drug therapy: Secondary | ICD-10-CM | POA: Diagnosis not present

## 2022-03-07 DIAGNOSIS — R0789 Other chest pain: Secondary | ICD-10-CM | POA: Diagnosis present

## 2022-03-07 DIAGNOSIS — R2243 Localized swelling, mass and lump, lower limb, bilateral: Secondary | ICD-10-CM | POA: Insufficient documentation

## 2022-03-07 NOTE — ED Provider Triage Note (Signed)
Emergency Medicine Provider Triage Evaluation Note ? ?Kristina Tran , a 66 y.o. female  was evaluated in triage.  Pt complains of chest pain onset earlier tonight.  Chest pain is pleuritic and without associated with shortness of breath.  Denies recent travel, exogenous estrogen, recent leg swelling or leg pain.  She does endorse bilateral lower extremity swelling 2 weeks ago that resolved.  No unilateral swelling.  Denies palpitations, lightheadedness.  Does have cough during the interview otherwise denied cough prior to arrival.  She is active and played 18 holes of golf today with cart.  She states typically she plays 9 holes.  No chest pain during golf session. ? ?Review of Systems  ?Positive: As above ?Negative: As above ? ?Physical Exam  ?BP (!) 142/84 (BP Location: Left Arm)   Pulse 94   Temp 98 ?F (36.7 ?C) (Oral)   Resp 18   SpO2 95%  ?Gen:   Awake, no distress   ?Resp:  Normal effort  ?MSK:   Moves extremities without difficulty  ?Other:  Without lower extremity swelling.  Lung sounds are clear to auscultation. ? ?Medical Decision Making  ?Medically screening exam initiated at 11:53 PM.  Appropriate orders placed.  Kristina Tran was informed that the remainder of the evaluation will be completed by another provider, this initial triage assessment does not replace that evaluation, and the importance of remaining in the ED until their evaluation is complete. ? ? ?  ?Marita Kansas, PA-C ?03/07/22 2356 ? ?

## 2022-03-07 NOTE — ED Notes (Signed)
Dr. Ophelia Charter called to let us know that patient called him to let him know that she was in the lobby and was here for chest pain. Patient has been in ER for 10 mins. Made note of call.  ?

## 2022-03-07 NOTE — ED Triage Notes (Addendum)
Pt presents with chest pain and shob described as a "hitch"  States is is left sided chest pain, non radiating. No n/v but some shob.  Pt states she played 18 holes of golf today.  Reports recent ankle swelling but states that has improved.  Pt states she took two 325 mg aspirin PTA ?

## 2022-03-08 ENCOUNTER — Emergency Department (HOSPITAL_COMMUNITY): Payer: Medicare Other

## 2022-03-08 DIAGNOSIS — R0789 Other chest pain: Secondary | ICD-10-CM | POA: Diagnosis not present

## 2022-03-08 LAB — BASIC METABOLIC PANEL
Anion gap: 12 (ref 5–15)
BUN: 35 mg/dL — ABNORMAL HIGH (ref 8–23)
CO2: 22 mmol/L (ref 22–32)
Calcium: 9.2 mg/dL (ref 8.9–10.3)
Chloride: 105 mmol/L (ref 98–111)
Creatinine, Ser: 1.66 mg/dL — ABNORMAL HIGH (ref 0.44–1.00)
GFR, Estimated: 34 mL/min — ABNORMAL LOW (ref >60)
Glucose, Bld: 111 mg/dL — ABNORMAL HIGH (ref 70–99)
Potassium: 3.5 mmol/L (ref 3.5–5.1)
Sodium: 139 mmol/L (ref 135–145)

## 2022-03-08 LAB — CBC
HCT: 42 % (ref 36.0–46.0)
Hemoglobin: 13.7 g/dL (ref 12.0–15.0)
MCH: 31.5 pg (ref 26.0–34.0)
MCHC: 32.6 g/dL (ref 30.0–36.0)
MCV: 96.6 fL (ref 80.0–100.0)
Platelets: 277 10*3/uL (ref 150–400)
RBC: 4.35 MIL/uL (ref 3.87–5.11)
RDW: 14.4 % (ref 11.5–15.5)
WBC: 9.4 10*3/uL (ref 4.0–10.5)
nRBC: 0 % (ref 0.0–0.2)

## 2022-03-08 LAB — D-DIMER, QUANTITATIVE: D-Dimer, Quant: <0.27 ug/mL-FEU (ref 0.00–0.50)

## 2022-03-08 LAB — TROPONIN I (HIGH SENSITIVITY)
Troponin I (High Sensitivity): 4 ng/L (ref <18)
Troponin I (High Sensitivity): 3 ng/L (ref <18)
Troponin I (High Sensitivity): 3 ng/L (ref <18)

## 2022-03-08 NOTE — ED Provider Notes (Signed)
?[facility] EMERGENCY DEPARTMENT ?Provider Note ? ? ?CSN: [id number] ?Arrival date & time: 03/07/22  2305 ? ?  ? ?History ? ?Chief Complaint  ?Patient presents with  ? Chest Pain  ? ? ?Kristina Tran is a 66 y.o. female. ? ?Pt is a 66 yo female with a pmhx significant for htn, hypothyroidism and bipolar d/o.  Pt said she normally plays 9 holes of golf, but played 18 yesterday.  Around 2000 last night, pt had left upper cp.  She said it felt like a "catch" when she took a deep breath.  She denies any sob now.  Cp is gone now.  She had some swelling in both legs about 2 weeks ago.  This went away with ibuprofen.  She denies f/c.  No n/v.  ? ? ?  ? ?Home Medications ?Prior to Admission medications   ?Medication Sig Start Date End Date Taking? Authorizing Provider  ?alendronate (FOSAMAX) 35 MG tablet Take 35 mg by mouth every 7 (seven) days. Take with a full glass of water on an empty stomach.    [provider]  ?ARIPiprazole (ABILIFY) 5 MG tablet Take 5 mg by mouth daily.  11/26/18   [provider]  ?buPROPion (WELLBUTRIN XL) 150 MG 24 hr tablet Take 150 mg by mouth daily.      [provider]  ?cyclobenzaprine (FLEXERIL) 5 MG tablet cyclobenzaprine 5 mg tablet    [provider]  ?divalproex (DEPAKOTE) 500 MG DR tablet Take 1,000 mg by mouth 2 (two) times daily.     [provider]  ?hydrochlorothiazide (HYDRODIURIL) 25 MG tablet Take 25 mg by mouth daily. 01/28/16   [provider]  ?icosapent Ethyl (VASCEPA) 1 g capsule Take 2 capsules (2 g total) by mouth 2 (two) times daily. 11/22/21   Nahser, Deloris Ping, MD  ?levothyroxine (SYNTHROID, LEVOTHROID) 100 MCG tablet Take 100 mcg by mouth daily.      [provider]  ?LORazepam (ATIVAN) 0.5 MG tablet Take 0.5 mg by mouth every 8 (eight) hours as needed for sleep.     [provider]  ?propranolol (INDERAL) 40 MG tablet Take 40 mg by mouth daily.  12/21/16   [provider]  ?rosuvastatin (CRESTOR) 20 MG tablet TAKE 1 TABLET(20 MG) BY MOUTH DAILY 08/10/21   Nahser, Deloris Ping, MD  ?traMADol (ULTRAM) 50 MG tablet Take 1 tablet (50 mg total) by mouth every 12 (twelve) hours as needed. for pain ?Patient not taking: Reported on 02/22/2022 01/30/22   Eldred Manges, MD  ?   ? ?Allergies    ?Sulfa antibiotics, Sulfonamide derivatives, Limonene, and Penicillins   ? ?Review of Systems   ?Review of Systems  ?Cardiovascular:  Positive for chest pain.  ?All other systems reviewed and are negative. ? ?Physical Exam ?Updated Vital Signs ?BP 120/85 (BP Location: Right Arm)   Pulse 75   Temp 97.6 ?F (36.4 ?C) (Oral)   Resp 19   Wt 82.6 kg   SpO2 96%   BMI 28.72 kg/m?  ?Physical Exam ?Vitals and nursing note reviewed.  ?Constitutional:   ?   Appearance: She is well-developed. She is obese.  ?HENT:  ?   Head: Normocephalic and atraumatic.  ?Eyes:  ?   Extraocular Movements: Extraocular movements intact.  ?   Pupils: Pupils are equal, round, and reactive to light.  ?Cardiovascular:  ?   Rate and Rhythm: Normal rate and regular rhythm.  ?   Heart sounds: Normal  heart sounds.  ?Pulmonary:  ?   Effort: Pulmonary effort is normal.  ?   Breath sounds: Normal breath sounds.  ?Abdominal:  ?   General: Bowel sounds are normal.  ?   Palpations: Abdomen is soft.  ?Musculoskeletal:     ?   General: Normal range of motion.  ?   Cervical back: Normal range of motion and neck supple.  ?Skin: ?   General: Skin is warm.  ?   Capillary Refill: Capillary refill takes less than 2 seconds.  ?Neurological:  ?   General: No focal deficit present.  ?   Mental Status: She is alert and oriented to person, place, and time.  ?Psychiatric:     ?   Mood and Affect: Mood normal.     ?   Behavior: Behavior normal.  ? ? ?ED Results / Procedures / Treatments   ?Labs ?(all labs ordered are listed, but only abnormal results are displayed) ?Labs Reviewed  ?BASIC METABOLIC PANEL - Abnormal; Notable for the following  components:  ?    Result Value  ? Glucose, Bld 111 (*)   ? BUN 35 (*)   ? Creatinine, Ser 1.66 (*)   ? GFR, Estimated 34 (*)   ? All other components within normal limits  ?CBC  ?D-DIMER, QUANTITATIVE  ?TROPONIN I (HIGH SENSITIVITY)  ?TROPONIN I (HIGH SENSITIVITY)  ?TROPONIN I (HIGH SENSITIVITY)  ? ? ?EKG ?EKG Interpretation ? ?Date/Time:  Tuesday Mar 07 2022 23:21:43 EDT ?Ventricular Rate:  91 ?PR Interval:  184 ?QRS Duration: 84 ?QT Interval:  374 ?QTC Calculation: 460 ?R Axis:   64 ?Text Interpretation: Normal sinus rhythm Normal ECG When compared with ECG of 26-Jan-2019 17:18, PREVIOUS ECG IS PRESENT No significant change since last tracing Confirmed by Jacalyn LefevreHaviland, Sriram Febles [phone]([id number]) on 03/08/2022 8:26:11 AM ? ?Radiology ?DG Chest 2 View ? ?Result Date: 03/08/2022 ?CLINICAL DATA:  Chest pain. EXAM: CHEST - 2 VIEW COMPARISON:  None Available. FINDINGS: The heart size and mediastinal contours are within normal limits. Both lungs are clear. The visualized skeletal structures are unremarkable. IMPRESSION: No active cardiopulmonary disease. Electronically Signed   By: Aram Candelahaddeus  Houston M.D.   On: 03/08/2022 00:05   ? ?Procedures ?Procedures  ? ? ?Medications Ordered in ED ?Medications - No data to display ? ?ED Course/ Medical Decision Making/ A&P ?  ?                        ?Medical Decision Making ?Amount and/or Complexity of Data Reviewed ?Labs: ordered. ?Radiology: ordered. ? ? ?This patient presents to the ED for concern of cp, this involves an extensive number of treatment options, and is a complaint that carries with it a high risk of complications and morbidity.  The differential diagnosis includes cardiac, pulm, gi, msk ? ? ?Co morbidities that complicate the patient evaluation ? ?htn, hypothyroidism and bipolar d/o ? ? ?Additional history obtained: ? ?Additional history obtained from epic chart review ?External records from outside source obtained and reviewed including husband ? ? ?Lab Tests: ? ?I Ordered, and  personally interpreted labs.  The pertinent results include:  cbc is nl; bmp with bun 35 and Cr 1.66 (chronic), 2 troponins neg ? ? ?Imaging Studies ordered: ? ?I ordered imaging studies including cxr  ?I independently visualized and interpreted imaging which showed  ?  ?IMPRESSION:  ?No active cardiopulmonary disease.  ? ?I agree with the radiologist interpretation ? ? ?Cardiac Monitoring: ? ?The patient was maintained  on a cardiac monitor.  I personally viewed and interpreted the cardiac monitored which showed an underlying rhythm of: nsr ? ? ?Medicines ordered and prescription drug management: ? ?I have reviewed the patients home medicines and have made adjustments as needed ? ? ?Test Considered: ? ?Cta chest, but ddimer neg ? ? ?Critical Interventions: ? ?Troponins and EKG ? ? ? ?Problem List / ED Course: ? ?CP:  likely related to playing 18 holes of golf.  Cardiac work up neg with neg troponins 8 hrs apart.  Ddimer neg. ? ? ?Reevaluation: ? ?After the interventions noted above, I reevaluated the patient and found that they have :improved ? ? ?Social Determinants of Health: ? ?Lives at home with husband ? ? ?Dispostion: ? ?After consideration of the diagnostic results and the patients response to treatment, I feel that the patent would benefit from discharge with outpatient f/u.   ? ? ? ? ? ? ? ?Final Clinical Impression(s) / ED Diagnoses ?Final diagnoses:  ?Atypical chest pain  ? ? ?Rx / DC Orders ?ED Discharge Orders   ? ? None  ? ?  ? ? ?  ?Jacalyn Lefevre, MD ?03/08/22 1123 ? ?

## 2022-05-02 ENCOUNTER — Other Ambulatory Visit (HOSPITAL_COMMUNITY): Payer: Self-pay | Admitting: *Deleted

## 2022-05-03 ENCOUNTER — Ambulatory Visit (HOSPITAL_COMMUNITY)
Admission: RE | Admit: 2022-05-03 | Discharge: 2022-05-03 | Disposition: A | Discharge status: Home / Self Care | Payer: Medicare Other | Source: Ambulatory Visit | Attending: Internal Medicine | Admitting: Internal Medicine

## 2022-05-03 DIAGNOSIS — M810 Age-related osteoporosis without current pathological fracture: Secondary | ICD-10-CM | POA: Diagnosis present

## 2022-05-03 MED ORDER — DENOSUMAB 60 MG/ML ~~LOC~~ SOSY
60.0000 mg | PREFILLED_SYRINGE | Freq: Once | SUBCUTANEOUS | Status: AC
Start: 2022-05-03 — End: 2022-05-03
  Administered 2022-05-03: 60 mg via SUBCUTANEOUS

## 2022-05-03 MED ORDER — DENOSUMAB 60 MG/ML ~~LOC~~ SOSY
PREFILLED_SYRINGE | SUBCUTANEOUS | Status: AC
  Filled 2022-05-03: qty 1

## 2022-05-18 ENCOUNTER — Other Ambulatory Visit: Payer: Self-pay | Admitting: Cardiovascular Disease

## 2022-05-27 ENCOUNTER — Encounter: Payer: Self-pay | Admitting: Cardiovascular Disease

## 2022-05-27 NOTE — Progress Notes (Unsigned)
Cardiology Office Note   Date:  05/29/2022   ID:  Kristina Tran, Kristina Tran 07/22/1956, MRN [id number]  PCP:  Kristina Quitter, MD  Cardiologist:   Kristina Miss, MD   Chief Complaint  Patient presents with   Hypertension             Kristina Tran is a 66 y.o. female who presents for follow up Hx of HTN, hyperlipidemia , hypothyroidsim, bipolar disease.  Kristina Tran has done well .  No episodes of CP ,  Dyspnea.  February 03, 2016:  Doing well.  Just got back from [place].   No CP or dyspnea.  Had some leg swelling bilaterally  Wore some compression hose which helped.  Her primary MD added HCTZ and decreased metoprolol to 1/2   January 29, 2017  Kristina Tran is doing well .  No CP or dyspnea.  No dizziness.  Not exercising much  Just got back form [place] recently .   March 04, 2018: Kristina Tran is seen today for follow-up of her hypertension and atypical chest pain.  Has a history of hyperlipidemia. Has bilateral achilies tendonitits Slowly improving Is on Lasix and HCTZ Discussed the [facility] Doctor leg rest.  Has gained some weight .  Playing golf  Looking forward to walking more   Mar 19, 2020: Kristina Tran is seen today for follow up of her HTN, HLD ,  and atypical CP  No cardiic issues.  Walks some, Plays golf regularly  Boxes once a week . Lipids are managed by Dr. Nadene Tran [facility])  [name] were high. She loves mayonaise and eats quite a bit of mayonnaise on a weekly basis.  May 23, 2021: Kristina Tran is seen today for follow up of her HTN, HLD, atypical CP  Just get back from [place] ( hunting  trip)  Golf weekly ,  Encourage more       May 29, 2022 Kristina Tran is seen for follow up of her HTN, HLD, atypical CP  Issues with low bone density [facility] ( [facility]) Has a skin cancer - will be seeing Dr. Irene Limbo for Mohs surgery .    Past Medical History:  Diagnosis Date   Bipolar 1 disorder (HCC)    Chest pain    History of echocardiogram    a. Echo  9/17: Mild LVH, EF 60-65, no RWMA, Gr 1 DD, trivial MR, normal RVSF, mild RAE   History of nuclear stress test    a. Nuclear stress test 9/17: EF 75%, no ischemia or scar, normal   Hypertension    Hypothyroidism    Leg edema     Past Surgical History:  Procedure Laterality Date   BREAST BIOPSY     BREAST EXCISIONAL BIOPSY Right    2000   CARDIOVASCULAR STRESS TEST  07/27/2010   EF 74%. NORMAL   US ECHOCARDIOGRAPHY  09/23/2004   EF 55-60%   WISDOM TOOTH EXTRACTION       Current Outpatient Medications  Medication Sig Dispense Refill   alendronate (FOSAMAX) 35 MG tablet Take 35 mg by mouth every 7 (seven) days. Take with a full glass of water on an empty stomach.     ARIPiprazole (ABILIFY) 5 MG tablet Take 5 mg by mouth daily.      buPROPion (WELLBUTRIN XL) 150 MG 24 hr tablet Take 150 mg by mouth daily.       cyclobenzaprine (FLEXERIL) 5 MG tablet cyclobenzaprine 5 mg tablet     divalproex (DEPAKOTE ER)  500 MG 24 hr tablet Take 500 mg by mouth. 3 tablets in the AM and 2 tablets in the PM     hydrochlorothiazide (HYDRODIURIL) 25 MG tablet Take 25 mg by mouth daily.  1   icosapent Ethyl (VASCEPA) 1 g capsule TAKE 2 CAPSULES(2 GRAMS) BY MOUTH TWICE DAILY 120 capsule 0   levothyroxine (SYNTHROID, LEVOTHROID) 100 MCG tablet Take 100 mcg by mouth daily.       LORazepam (ATIVAN) 0.5 MG tablet Take 0.5 mg by mouth every 8 (eight) hours as needed for sleep.      propranolol (INDERAL) 40 MG tablet Take 40 mg by mouth daily.      rosuvastatin (CRESTOR) 20 MG tablet TAKE 1 TABLET(20 MG) BY MOUTH DAILY 60 tablet 9   divalproex (DEPAKOTE) 500 MG DR tablet Take 1,000 mg by mouth 2 (two) times daily.  (Patient not taking: Reported on 05/29/2022)     traMADol (ULTRAM) 50 MG tablet Take 1 tablet (50 mg total) by mouth every 12 (twelve) hours as needed. for pain (Patient not taking: Reported on 05/29/2022) 20 tablet 0   No current facility-administered medications for this visit.    Allergies:    Sulfa antibiotics, Sulfonamide derivatives, Limonene, and Penicillins    Social History:  The patient  reports that she quit smoking about 40 years ago. Her smoking use included cigarettes. She has quit using smokeless tobacco. She reports that she does not drink alcohol and does not use drugs.   Family History:  The patient's family history includes Breast cancer in her mother and sister; Hypertension in her mother.    ROS: Noted in current history, all other review of systems are negative.   Physical Exam: Blood pressure 116/76, pulse 72, height 5' 6.75" (1.695 m), weight 188 lb (85.3 kg), SpO2 95 %.  GEN:  Well nourished, well developed in no acute distress HEENT: Normal NECK: No JVD; No carotid bruits LYMPHATICS: No lymphadenopathy CARDIAC: RRR , no murmurs, rubs, gallops RESPIRATORY:  Clear to auscultation without rales, wheezing or rhonchi  ABDOMEN: Soft, non-tender, non-distended MUSCULOSKELETAL:  No edema; No deformity  SKIN: Warm and dry NEUROLOGIC:  Alert and oriented x 3   EKG:    Recent Labs: 06/13/2021: ALT 16 03/07/2022: BUN 35; Creatinine, Ser 1.66; Hemoglobin 13.7; Platelets 277; Potassium 3.5; Sodium 139    Lipid Panel    Component Value Date/Time   CHOL 217 (H) 09/12/2021 0818   TRIG 366 (H) 09/12/2021 0818   HDL 47 09/12/2021 0818   CHOLHDL 4.6 (H) 09/12/2021 0818   CHOLHDL 2 11/25/2013 1037   VLDL 23.6 11/25/2013 1037   LDLCALC 108 (H) 09/12/2021 0818   LDLDIRECT 135.5 10/02/2012 0936      Wt Readings from Last 3 Encounters:  05/29/22 188 lb (85.3 kg)  03/08/22 182 lb (82.6 kg)  02/22/22 179 lb (81.2 kg)      Other studies Reviewed: Additional studies/ records that were reviewed today include: . Review of the above records demonstrates:    ASSESSMENT AND PLAN:  1.  HTN:    Blood pressure is well controlled.  Continue current medications.   2. Hyperlipidemia.    Followed by Dr. Nadene Tran at [facility]. .  Last LDL is  108.   Current medicines are reviewed at length with the patient today.  The patient does not have concerns regarding medicines.  The following changes have been made:  no change  Labs/ tests ordered today include:   No orders of  the defined types were placed in this encounter.      Signed, Kristina Miss, MD  05/29/2022 6:02 PM    [facility] [address] [place], [place], [place]  [address] Phone: [phone]; Fax: [phone]

## 2022-05-29 ENCOUNTER — Ambulatory Visit (INDEPENDENT_AMBULATORY_CARE_PROVIDER_SITE_OTHER): Payer: Medicare Other | Admitting: Cardiovascular Disease

## 2022-05-29 ENCOUNTER — Encounter: Payer: Self-pay | Admitting: Cardiovascular Disease

## 2022-05-29 VITALS — BP 116/76 | HR 72 | Ht 66.75 in | Wt 188.0 lb

## 2022-05-29 DIAGNOSIS — I10 Essential (primary) hypertension: Secondary | ICD-10-CM | POA: Diagnosis not present

## 2022-05-29 NOTE — Patient Instructions (Signed)
Medication Instructions:  ° °Your physician recommends that you continue on your current medications as directed. Please refer to the Current Medication list given to you today. ° °*If you need a refill on your cardiac medications before your next appointment, please call your pharmacy* ° ° ° °Follow-Up: °At [facility], you and your health needs are our priority.  As part of our continuing mission to provide you with exceptional heart care, we have created designated Provider Care Teams.  These Care Teams include your primary Cardiologist (physician) and Advanced Practice Providers (APPs -  Physician Assistants and Nurse Practitioners) who all work together to provide you with the care you need, when you need it. ° °We recommend signing up for the patient portal called "MyChart".  Sign up information is provided on this After Visit Summary.  MyChart is used to connect with patients for Virtual Visits (Telemedicine).  Patients are able to view lab/test results, encounter notes, upcoming appointments, etc.  Non-urgent messages can be sent to your provider as well.   °To learn more about what you can do with MyChart, go to [email or web address].   ° °Your next appointment:   °1 year(s) ° °The format for your next appointment:   °In Person ° °Provider:   °Philip Nahser, MD { ° ° °

## 2022-06-24 ENCOUNTER — Other Ambulatory Visit: Payer: Self-pay | Admitting: Cardiovascular Disease

## 2022-06-26 ENCOUNTER — Other Ambulatory Visit: Payer: Self-pay

## 2022-06-26 MED ORDER — ICOSAPENT ETHYL 1 G PO CAPS
ORAL_CAPSULE | ORAL | 3 refills | Status: DC
Start: 2022-06-26 — End: 2023-03-13

## 2022-08-01 ENCOUNTER — Ambulatory Visit: Payer: Medicare Other

## 2022-08-01 ENCOUNTER — Ambulatory Visit (INDEPENDENT_AMBULATORY_CARE_PROVIDER_SITE_OTHER): Payer: Medicare Other | Admitting: Orthopaedic Surgery

## 2022-08-01 VITALS — BP 103/73 | HR 75 | Ht 66.75 in | Wt 188.0 lb

## 2022-08-01 DIAGNOSIS — M25552 Pain in left hip: Secondary | ICD-10-CM

## 2022-08-04 DIAGNOSIS — M25552 Pain in left hip: Secondary | ICD-10-CM

## 2022-08-04 MED ORDER — BUPIVACAINE HCL 0.25 % IJ SOLN
2.0000 mL | INTRAMUSCULAR | Status: AC | PRN
  Administered 2022-08-04: 2 mL via INTRA_ARTICULAR

## 2022-08-04 MED ORDER — METHYLPREDNISOLONE ACETATE 40 MG/ML IJ SUSP
40.0000 mg | INTRAMUSCULAR | Status: AC | PRN
  Administered 2022-08-04: 40 mg via INTRA_ARTICULAR

## 2022-08-04 MED ORDER — LIDOCAINE HCL 1 % IJ SOLN
1.0000 mL | INTRAMUSCULAR | Status: AC | PRN
  Administered 2022-08-04: 1 mL

## 2022-08-04 NOTE — Progress Notes (Signed)
Office Visit Note   Patient: Kristina Tran           Date of Birth: 1956/08/31           MRN: [id number] Visit Date: 08/01/2022              Requested by: Melida Quitter, MD [id number] [place],  [place] [address] PCP: Melida Quitter, MD   Assessment & Plan: Visit Diagnoses:  1. Pain in left hip     Plan: Sacroiliac injection performed on the left she tolerated it well.  Follow-Up Instructions: No follow-ups on file.   Orders:  Orders Placed This Encounter  Procedures   XR HIP UNILAT W OR W/O PELVIS 2-3 VIEWS LEFT   No orders of the defined types were placed in this encounter.     Procedures: Large Joint Inj on 08/04/2022 10:26 AM Details: 22 G needle, posterior approach Medications: 1 mL lidocaine 1 %; 2 mL bupivacaine 0.25 %; 40 mg methylPREDNISolone acetate 40 MG/ML      Clinical Data: No additional findings.   Subjective: Chief Complaint  Patient presents with   Left Hip - Pain    Left hip cortisone inj    HPI 66 year old female with left posterior hip pain.  She has had stiffness taking ibuprofen without relief.  Aching pain and she states she is requesting cortisone injection.  She has a member gas coming up in 2 days.  Previous injection in the past has given her good relief she points directly over the left sacroiliac joint where she is having increased pain.  No bowel bladder symptoms.  No fever or chills.  No rheumatologic conditions.  Review of Systems all systems noncontributory to HPI.   Objective: Vital Signs: BP 103/73   Pulse 75   Ht 5' 6.75" (1.695 m)   Wt 188 lb (85.3 kg)   BMI 29.67 kg/m   Physical Exam Constitutional:      Appearance: She is well-developed.  HENT:     Head: Normocephalic.     Right Ear: External ear normal.     Left Ear: External ear normal. There is no impacted cerumen.  Eyes:     Pupils: Pupils are equal, round, and reactive to light.  Neck:     Thyroid: No thyromegaly.     Trachea: No tracheal  deviation.  Cardiovascular:     Rate and Rhythm: Normal rate.  Pulmonary:     Effort: Pulmonary effort is normal.  Abdominal:     Palpations: Abdomen is soft.  Musculoskeletal:     Cervical back: No rigidity.  Skin:    General: Skin is warm and dry.  Neurological:     Mental Status: She is alert and oriented to person, place, and time.  Psychiatric:        Behavior: Behavior normal.     Ortho Exam tenderness over the left SI joint reproducing her pain.  Positive Pearlean Brownie.  Reflexes are intact negative logroll hips.  Specialty Comments:  No specialty comments available.  Imaging: No results found.   PMFS History: Patient Active Problem List   Diagnosis Date Noted   Foot sprain, left, initial encounter 02/22/2022   Popliteus tendinitis of left lower extremity 10/18/2021   Degenerative spondylolisthesis 10/18/2021   Trochanteric bursitis, left hip 03/18/2020   Hyperlipidemia 11/25/2013   Bipolar affective disorder (HCC) 10/02/2012   HTN (hypertension) 09/22/2011   Hypothyroidism 09/22/2011   Past Medical History:  Diagnosis Date   Bipolar 1  disorder ([place])    Chest pain    History of echocardiogram    a. Echo 9/17: Mild LVH, EF 60-65, no RWMA, Gr 1 DD, trivial MR, normal RVSF, mild RAE   History of nuclear stress test    a. Nuclear stress test 9/17: EF 75%, no ischemia or scar, normal   Hypertension    Hypothyroidism    Leg edema     Family History  Problem Relation Age of Onset   Breast cancer Mother    Hypertension Mother    Breast cancer Sister     Past Surgical History:  Procedure Laterality Date   BREAST BIOPSY     BREAST EXCISIONAL BIOPSY Right    2000   CARDIOVASCULAR STRESS TEST  07/27/2010   EF 74%. NORMAL   US ECHOCARDIOGRAPHY  09/23/2004   EF 55-60%   WISDOM TOOTH EXTRACTION     Social History   Occupational History   Not on file  Tobacco Use   Smoking status: Former    Types: Cigarettes    Quit date: 11/06/1981    Years since quitting:  40.7   Smokeless tobacco: Former  Substance and Sexual Activity   Alcohol use: No   Drug use: No   Sexual activity: Not on file

## 2022-08-24 ENCOUNTER — Other Ambulatory Visit: Payer: Self-pay

## 2022-08-24 MED ORDER — ROSUVASTATIN CALCIUM 20 MG PO TABS: ORAL_TABLET | ORAL | 9 refills | Status: DC

## 2022-09-27 ENCOUNTER — Encounter: Payer: Self-pay | Admitting: Surgery

## 2022-09-27 ENCOUNTER — Ambulatory Visit (INDEPENDENT_AMBULATORY_CARE_PROVIDER_SITE_OTHER): Payer: Medicare Other | Admitting: Surgery

## 2022-09-27 ENCOUNTER — Ambulatory Visit: Payer: Self-pay

## 2022-09-27 DIAGNOSIS — M722 Plantar fascial fibromatosis: Secondary | ICD-10-CM

## 2022-09-27 DIAGNOSIS — M79672 Pain in left foot: Secondary | ICD-10-CM | POA: Diagnosis not present

## 2022-09-27 DIAGNOSIS — M84375A Stress fracture, left foot, initial encounter for fracture: Secondary | ICD-10-CM

## 2022-09-27 NOTE — Progress Notes (Signed)
Office Visit Note   Patient: Kristina Tran           Date of Birth: 1955/11/08           MRN: [id number] Visit Date: 09/27/2022              Requested by: Melida Quitter, MD [address] [place],  [place] [address] PCP: Melida Quitter, MD   Assessment & Plan: Visit Diagnoses:  1. Left foot pain   2. 2-4 metatarsal Stress reaction of left foot, initial encounter   3. Plantar fasciitis of left foot     Plan: Today patient was put in a short cam boot.  Follow-up with Dr. Lajoyce Corners in 1 week for recheck to discuss options.  She can weight-bear as tolerated in the boot.  Follow-Up Instructions: Return in about 1 week (around 10/04/2022) for with dr duda for recheck left foot.   Orders:  Orders Placed This Encounter  Procedures   XR Foot Complete Left   No orders of the defined types were placed in this encounter.     Procedures: No procedures performed   Clinical Data: No additional findings.   Subjective: No chief complaint on file.   HPI 66 year old white female comes in with complaints of left foot pain.  States that about a week ago she was in [place] walking a lot and she stepped off a curb injuring her foot.  Localizes most of the pain to the midfoot area.  Aggravated when she is weightbearing.  She seen Dr. Lajoyce Corners in the past about 5 years ago for bilateral Achilles tendon contractures. Review of Systems No current complaints of cardiopulmonary GI/GU issues  Objective: Vital Signs: There were no vitals taken for this visit.  Physical Exam HENT:     Head: Normocephalic and atraumatic.     Nose: Nose normal.  Eyes:     Extraocular Movements: Extraocular movements intact.  Pulmonary:     Effort: No respiratory distress.  Musculoskeletal:     Comments: Gait is somewhat antalgic.  Patient has tenderness at the tarsometatarsal joint.  Also moderately tender across the mid 2nd-4th metatarsals.  Minimal swelling.  No bruising.  Mild to moderate tender at the  plantar fascia calcaneal insertion.  Ankle good range of motion.  Ankle is nontender.  Neurological:     Mental Status: She is alert and oriented to person, place, and time.  Psychiatric:        Mood and Affect: Mood normal.     Ortho Exam  Specialty Comments:  No specialty comments available.  Imaging: No results found.   PMFS History: Patient Active Problem List   Diagnosis Date Noted   Foot sprain, left, initial encounter 02/22/2022   Popliteus tendinitis of left lower extremity 10/18/2021   Degenerative spondylolisthesis 10/18/2021   Trochanteric bursitis, left hip 03/18/2020   Hyperlipidemia 11/25/2013   Bipolar affective disorder (HCC) 10/02/2012   HTN (hypertension) 09/22/2011   Hypothyroidism 09/22/2011   Past Medical History:  Diagnosis Date   Bipolar 1 disorder (HCC)    Chest pain    History of echocardiogram    a. Echo 9/17: Mild LVH, EF 60-65, no RWMA, Gr 1 DD, trivial MR, normal RVSF, mild RAE   History of nuclear stress test    a. Nuclear stress test 9/17: EF 75%, no ischemia or scar, normal   Hypertension    Hypothyroidism    Leg edema     Family History  Problem Relation  Age of Onset   Breast cancer Mother    Hypertension Mother    Breast cancer Sister     Past Surgical History:  Procedure Laterality Date   BREAST BIOPSY     BREAST EXCISIONAL BIOPSY Right    2000   CARDIOVASCULAR STRESS TEST  07/27/2010   EF 74%. NORMAL   US ECHOCARDIOGRAPHY  09/23/2004   EF 55-60%   WISDOM TOOTH EXTRACTION     Social History   Occupational History   Not on file  Tobacco Use   Smoking status: Former    Types: Cigarettes    Quit date: 11/06/1981    Years since quitting: 40.9   Smokeless tobacco: Former  Substance and Sexual Activity   Alcohol use: No   Drug use: No   Sexual activity: Not on file

## 2022-09-29 ENCOUNTER — Telehealth: Payer: Self-pay | Admitting: Orthopedic Surgery

## 2022-09-29 MED ORDER — TRAMADOL HCL 50 MG PO TABS: 50.0000 mg | ORAL_TABLET | Freq: Four times a day (QID) | ORAL | 0 refills | Status: AC | PRN

## 2022-09-29 NOTE — Telephone Encounter (Signed)
  Orthopedic Telephone Call   Patient was seen in the office recently for a foot injury. States she stepped off a higher step and landed on her foot. Has had pain since that time. Pain is on the medial aspect of the foot and the dorsal aspect of the foot. She describes the region of the forefoot. She has been elevating and using advil once per day.    Instructed her to keep elevating above the level of the heart. Told her to apply ice to the affected area. She can use tylenol 1000mg  TID and advil 200mg  TID for the pain. I prescribed her tramadol for this acute pain. She is supposed to return to the office soon so she should keep that appointment.    , MD Orthopedic Surgeon

## 2022-09-29 NOTE — Telephone Encounter (Addendum)
Orthopedic Telephone Call  Patient was seen in the office recently for a foot injury. States she stepped off a higher step and landed on her foot. Has had pain since that time. Pain is on the medial aspect of the foot and the dorsal aspect of the foot. She describes the region of the forefoot. She has been elevating and using advil once per day.   Instructed her to keep elevating above the level of the heart. Told her to apply ice to the affected area. She can use tylenol 1000mg  TID and advil 200mg  TID for the pain. I prescribed her tramadol for this acute pain. She is supposed to return to the office soon so she should keep that appointment.   , MD Orthopedic Surgeon

## 2022-10-06 ENCOUNTER — Telehealth: Payer: Self-pay | Admitting: Radiology

## 2022-10-06 ENCOUNTER — Ambulatory Visit: Payer: Medicare Other | Admitting: Orthopaedic Surgery

## 2022-10-06 ENCOUNTER — Ambulatory Visit (INDEPENDENT_AMBULATORY_CARE_PROVIDER_SITE_OTHER): Payer: Medicare Other | Admitting: Orthopaedic Surgery

## 2022-10-06 DIAGNOSIS — M7989 Other specified soft tissue disorders: Secondary | ICD-10-CM | POA: Diagnosis not present

## 2022-10-06 DIAGNOSIS — M79605 Pain in left leg: Secondary | ICD-10-CM

## 2022-10-06 DIAGNOSIS — S93602A Unspecified sprain of left foot, initial encounter: Secondary | ICD-10-CM

## 2022-10-06 NOTE — Progress Notes (Unsigned)
Office Visit Note   Patient: Kristina Tran           Date of Birth: 01-Feb-1956           MRN: [id number] Visit Date: 10/06/2022              Requested by: Melida Quitter, MD [address] [place],  [place] [address] PCP: Melida Quitter, MD   Assessment & Plan: Visit Diagnoses:  1. Foot sprain, left, initial encounter     Plan: We will place her in some Ted hose for the swelling.  Doppler test Monday make sure there is no evidence of calf DVT.  Continue cam boot.  Follow-Up Instructions: No follow-ups on file.   Orders:  No orders of the defined types were placed in this encounter.  No orders of the defined types were placed in this encounter.     Procedures: No procedures performed   Clinical Data: No additional findings.   Subjective: Chief Complaint  Patient presents with   Left Foot - Pain, Follow-up   Left Ankle - Pain, Follow-up    HPI 66 year old female with problems with her left foot when she stepped down over a high curb on the street with sharp pain in her left foot.  She had pain in the midfoot mostly lateral does not recall she turned turned it over a rolled it.  She is use Tylenol ibuprofen.  She has bilateral lower extremity swelling fairly symmetrical.  She has been walking better in a short cam boot.  Review of Systems positive for hypertension degenerative spondylolisthesis.  History of bipolar disorder on medications.  Negative for DVT.   Objective: Vital Signs: There were no vitals taken for this visit.  Physical Exam Constitutional:      Appearance: She is well-developed.  HENT:     Head: Normocephalic.     Right Ear: External ear normal.     Left Ear: External ear normal. There is no impacted cerumen.  Eyes:     Pupils: Pupils are equal, round, and reactive to light.  Neck:     Thyroid: No thyromegaly.     Trachea: No tracheal deviation.  Cardiovascular:     Rate and Rhythm: Normal rate.  Pulmonary:     Effort: Pulmonary  effort is normal.  Abdominal:     Palpations: Abdomen is soft.  Musculoskeletal:     Cervical back: No rigidity.  Skin:    General: Skin is warm and dry.  Neurological:     Mental Status: She is alert and oriented to person, place, and time.  Psychiatric:        Behavior: Behavior normal.     Ortho Exam patient has some tenderness over the calcaneocuboid joint.  Peroneals are active mildly painful.  Anterolateral ankle joint is minimally tender negative anterior drawer.  She has slight pitting edema both lower extremities fairly symmetrical.  Specialty Comments:  No specialty comments available.  Imaging: No results found.   PMFS History: Patient Active Problem List   Diagnosis Date Noted   Foot sprain, left, initial encounter 02/22/2022   Popliteus tendinitis of left lower extremity 10/18/2021   Degenerative spondylolisthesis 10/18/2021   Trochanteric bursitis, left hip 03/18/2020   Hyperlipidemia 11/25/2013   Bipolar affective disorder (HCC) 10/02/2012   HTN (hypertension) 09/22/2011   Hypothyroidism 09/22/2011   Past Medical History:  Diagnosis Date   Bipolar 1 disorder (HCC)    Chest pain    History of echocardiogram  a. Echo 9/17: Mild LVH, EF 60-65, no RWMA, Gr 1 DD, trivial MR, normal RVSF, mild RAE   History of nuclear stress test    a. Nuclear stress test 9/17: EF 75%, no ischemia or scar, normal   Hypertension    Hypothyroidism    Leg edema     Family History  Problem Relation Age of Onset   Breast cancer Mother    Hypertension Mother    Breast cancer Sister     Past Surgical History:  Procedure Laterality Date   BREAST BIOPSY     BREAST EXCISIONAL BIOPSY Right    2000   CARDIOVASCULAR STRESS TEST  07/27/2010   EF 74%. NORMAL   US ECHOCARDIOGRAPHY  09/23/2004   EF 55-60%   WISDOM TOOTH EXTRACTION     Social History   Occupational History   Not on file  Tobacco Use   Smoking status: Former    Types: Cigarettes    Quit date: 11/06/1981     Years since quitting: 40.9   Smokeless tobacco: Former  Substance and Sexual Activity   Alcohol use: No   Drug use: No   Sexual activity: Not on file

## 2022-10-06 NOTE — Telephone Encounter (Signed)
LLE doppler entered for patient to rule out left calf DVT. Patient is asking to go Monday morning around 10 (she has a meeting at 1:30) or any time on Tuesday. Could you please schedule? Order has been entered.

## 2022-10-07 ENCOUNTER — Other Ambulatory Visit: Payer: Self-pay | Admitting: Orthopedic Surgery

## 2022-10-09 ENCOUNTER — Other Ambulatory Visit: Payer: Self-pay | Admitting: Orthopaedic Surgery

## 2022-10-09 ENCOUNTER — Telehealth: Payer: Self-pay | Admitting: Orthopaedic Surgery

## 2022-10-09 MED ORDER — TRAMADOL HCL 50 MG PO TABS: 50.0000 mg | ORAL_TABLET | Freq: Four times a day (QID) | ORAL | 0 refills | Status: DC | PRN

## 2022-10-09 NOTE — Telephone Encounter (Signed)
Patient would like Tramadol sent to [facility] on [address], please advise

## 2022-10-09 NOTE — Telephone Encounter (Signed)
I called patient and advised. She wanted me to let you know that she is not having a lot of pain, but wanted to have the tramadol just in case.

## 2022-10-09 NOTE — Telephone Encounter (Signed)
Pt aware of appt scheduled 10/10/22 at [facility] at 8am

## 2022-10-10 ENCOUNTER — Ambulatory Visit (HOSPITAL_COMMUNITY)
Admission: RE | Admit: 2022-10-10 | Discharge: 2022-10-10 | Disposition: A | Discharge status: Home / Self Care | Payer: Medicare Other | Source: Ambulatory Visit | Attending: Orthopaedic Surgery | Admitting: Orthopaedic Surgery

## 2022-10-10 DIAGNOSIS — M7989 Other specified soft tissue disorders: Secondary | ICD-10-CM | POA: Diagnosis present

## 2022-10-10 DIAGNOSIS — M79605 Pain in left leg: Secondary | ICD-10-CM | POA: Diagnosis present

## 2022-10-16 ENCOUNTER — Encounter: Payer: Self-pay | Admitting: Orthopedic Surgery

## 2022-10-16 ENCOUNTER — Ambulatory Visit (INDEPENDENT_AMBULATORY_CARE_PROVIDER_SITE_OTHER): Payer: Medicare Other | Admitting: Orthopedic Surgery

## 2022-10-16 DIAGNOSIS — S93622A Sprain of tarsometatarsal ligament of left foot, initial encounter: Secondary | ICD-10-CM | POA: Diagnosis not present

## 2022-10-16 NOTE — Progress Notes (Signed)
Office Visit Note   Patient: Kristina Tran           Date of Birth: [birth year]           MRN: [id number] Visit Date: 10/16/2022              Requested by: Melida Quitter, MD [address] [place],  [place] [address] PCP: Melida Quitter, MD  Chief Complaint  Patient presents with   Left Foot - Injury      HPI: Patient is a 66 year old woman is seen for initial evaluation for left foot midfoot pain.  Patient states she was in [place] she stepped down off a curb and landed hard on her foot that she did not roll her ankle did not have any deformity or fall.  Assessment & Plan: Visit Diagnoses:  1. Lisfranc's sprain, left, initial encounter     Plan: Patient has a sprain across the Lisfranc complex recommended continue with the fracture boot and compression.  Obtain three-view radiographs of the left foot at follow-up.  Follow-Up Instructions: Return in about 4 weeks (around 11/13/2022).   Ortho Exam  Patient is alert, oriented, no adenopathy, well-dressed, normal affect, normal respiratory effort. Examination patient has good pulses she does have pitting edema in the left foot and leg.  She has pain to palpation across the Lisfranc complex and distraction across the forefoot reproduces pain across the Lisfranc complex.  Radiograph shows no displacement or bony flecks.  Imaging: No results found. No images are attached to the encounter.  Labs: No results found for: "HGBA1C", "ESRSEDRATE", "CRP", "LABURIC", "REPTSTATUS", "GRAMSTAIN", "CULT", "LABORGA"   Lab Results  Component Value Date   ALBUMIN 4.3 06/13/2021   ALBUMIN 3.7 01/26/2019   ALBUMIN 3.6 11/25/2013    No results found for: "MG" No results found for: "VD25OH"  No results found for: "PREALBUMIN"    Latest Ref Rng & Units 03/07/2022   11:46 PM 01/26/2019    5:28 PM  CBC EXTENDED  WBC 4.0 - 10.5 K/uL 9.4  9.2   RBC 3.87 - 5.11 MIL/uL 4.35  4.84   Hemoglobin 12.0 - 15.0 g/dL 41.9  62.2   HCT 29.7  - 46.0 % 42.0  44.5   Platelets 150 - 400 K/uL 277  258   NEUT# 1.7 - 7.7 K/uL  4.4   Lymph# 0.7 - 4.0 K/uL  3.8      There is no height or weight on file to calculate BMI.  Orders:  No orders of the defined types were placed in this encounter.  No orders of the defined types were placed in this encounter.    Procedures: No procedures performed  Clinical Data: No additional findings.  ROS:  All other systems negative, except as noted in the HPI. Review of Systems  Objective: Vital Signs: There were no vitals taken for this visit.  Specialty Comments:  No specialty comments available.  PMFS History: Patient Active Problem List   Diagnosis Date Noted   Foot sprain, left, initial encounter 02/22/2022   Popliteus tendinitis of left lower extremity 10/18/2021   Degenerative spondylolisthesis 10/18/2021   Trochanteric bursitis, left hip 03/18/2020   Hyperlipidemia 11/25/2013   Bipolar affective disorder (HCC) 10/02/2012   HTN (hypertension) 09/22/2011   Hypothyroidism 09/22/2011   Past Medical History:  Diagnosis Date   Bipolar 1 disorder (HCC)    Chest pain    History of echocardiogram    a. Echo 9/17: Mild LVH, EF 60-65,  no RWMA, Gr 1 DD, trivial MR, normal RVSF, mild RAE   History of nuclear stress test    a. Nuclear stress test 9/17: EF 75%, no ischemia or scar, normal   Hypertension    Hypothyroidism    Leg edema     Family History  Problem Relation Age of Onset   Breast cancer Mother    Hypertension Mother    Breast cancer Sister     Past Surgical History:  Procedure Laterality Date   BREAST BIOPSY     BREAST EXCISIONAL BIOPSY Right    2000   CARDIOVASCULAR STRESS TEST  07/27/2010   EF 74%. NORMAL   US ECHOCARDIOGRAPHY  09/23/2004   EF 55-60%   WISDOM TOOTH EXTRACTION     Social History   Occupational History   Not on file  Tobacco Use   Smoking status: Former    Types: Cigarettes    Quit date: 11/06/1981    Years since quitting: 40.9    Smokeless tobacco: Former  Substance and Sexual Activity   Alcohol use: No   Drug use: No   Sexual activity: Not on file

## 2022-11-13 ENCOUNTER — Ambulatory Visit (INDEPENDENT_AMBULATORY_CARE_PROVIDER_SITE_OTHER): Payer: Medicare Other | Admitting: Orthopedic Surgery

## 2022-11-13 ENCOUNTER — Encounter: Payer: Self-pay | Admitting: Orthopedic Surgery

## 2022-11-13 ENCOUNTER — Ambulatory Visit (INDEPENDENT_AMBULATORY_CARE_PROVIDER_SITE_OTHER): Payer: Medicare Other

## 2022-11-13 DIAGNOSIS — S93602A Unspecified sprain of left foot, initial encounter: Secondary | ICD-10-CM

## 2022-11-13 DIAGNOSIS — S93622A Sprain of tarsometatarsal ligament of left foot, initial encounter: Secondary | ICD-10-CM | POA: Diagnosis not present

## 2022-11-13 NOTE — Progress Notes (Signed)
Office Visit Note   Patient: Kristina Tran           Date of Birth: [birth year]           MRN: [id number] Visit Date: 11/13/2022              Requested by: Melida Quitter, MD [address] [place],  [place] [address] PCP: Melida Quitter, MD  Chief Complaint  Patient presents with   Left Foot - Follow-up      HPI: Patient is a 67 year old woman who is seen in follow-up for acute Lisfranc sprain left foot.  Patient is currently ambulating in a fracture boot.  She states she has been developing some numbness on the plantar aspect of her foot from the fracture boot.  Assessment & Plan: Visit Diagnoses:  1. Foot sprain, left, initial encounter   2. Lisfranc's sprain, left, initial encounter     Plan: Patient will wean out of the fracture boot into a new balance walking sneaker.  Discussed that if she still has symptoms across the Lisfranc complex we should follow-up and consider surgical intervention.  Follow-Up Instructions: Return if symptoms worsen or fail to improve.   Ortho Exam  Patient is alert, oriented, no adenopathy, well-dressed, normal affect, normal respiratory effort. Examination patient has no swelling or cellulitis in her foot.  There is essentially no pain to palpation across the Lisfranc complex.  The metatarsal heads are tender to palpation.  Imaging: XR Foot Complete Left  Result Date: 11/13/2022 Three-view radiographs of the left foot shows no displacement across the Lisfranc complex.  There is no widening on the plantar aspect of the first metatarsal medial cuneiform.  No images are attached to the encounter.  Labs: No results found for: "HGBA1C", "ESRSEDRATE", "CRP", "LABURIC", "REPTSTATUS", "GRAMSTAIN", "CULT", "LABORGA"   Lab Results  Component Value Date   ALBUMIN 4.3 06/13/2021   ALBUMIN 3.7 01/26/2019   ALBUMIN 3.6 11/25/2013    No results found for: "MG" No results found for: "VD25OH"  No results found for: "PREALBUMIN"    Latest Ref  Rng & Units 03/07/2022   11:46 PM 01/26/2019    5:28 PM  CBC EXTENDED  WBC 4.0 - 10.5 K/uL 9.4  9.2   RBC 3.87 - 5.11 MIL/uL 4.35  4.84   Hemoglobin 12.0 - 15.0 g/dL 27.2  53.6   HCT 64.4 - 46.0 % 42.0  44.5   Platelets 150 - 400 K/uL 277  258   NEUT# 1.7 - 7.7 K/uL  4.4   Lymph# 0.7 - 4.0 K/uL  3.8      There is no height or weight on file to calculate BMI.  Orders:  Orders Placed This Encounter  Procedures   XR Foot Complete Left   No orders of the defined types were placed in this encounter.    Procedures: No procedures performed  Clinical Data: No additional findings.  ROS:  All other systems negative, except as noted in the HPI. Review of Systems  Objective: Vital Signs: There were no vitals taken for this visit.  Specialty Comments:  No specialty comments available.  PMFS History: Patient Active Problem List   Diagnosis Date Noted   Foot sprain, left, initial encounter 02/22/2022   Popliteus tendinitis of left lower extremity 10/18/2021   Degenerative spondylolisthesis 10/18/2021   Trochanteric bursitis, left hip 03/18/2020   Hyperlipidemia 11/25/2013   Bipolar affective disorder (HCC) 10/02/2012   HTN (hypertension) 09/22/2011   Hypothyroidism 09/22/2011   Past  Medical History:  Diagnosis Date   Bipolar 1 disorder ([place])    Chest pain    History of echocardiogram    a. Echo 9/17: Mild LVH, EF 60-65, no RWMA, Gr 1 DD, trivial MR, normal RVSF, mild RAE   History of nuclear stress test    a. Nuclear stress test 9/17: EF 75%, no ischemia or scar, normal   Hypertension    Hypothyroidism    Leg edema     Family History  Problem Relation Age of Onset   Breast cancer Mother    Hypertension Mother    Breast cancer Sister     Past Surgical History:  Procedure Laterality Date   BREAST BIOPSY     BREAST EXCISIONAL BIOPSY Right    2000   CARDIOVASCULAR STRESS TEST  07/27/2010   EF 74%. NORMAL   US ECHOCARDIOGRAPHY  09/23/2004   EF 55-60%   WISDOM  TOOTH EXTRACTION     Social History   Occupational History   Not on file  Tobacco Use   Smoking status: Former    Types: Cigarettes    Quit date: 11/06/1981    Years since quitting: 41.0   Smokeless tobacco: Former  Substance and Sexual Activity   Alcohol use: No   Drug use: No   Sexual activity: Not on file

## 2022-12-08 ENCOUNTER — Encounter: Payer: Self-pay | Admitting: Orthopaedic Surgery

## 2022-12-08 ENCOUNTER — Ambulatory Visit (INDEPENDENT_AMBULATORY_CARE_PROVIDER_SITE_OTHER): Payer: Medicare Other | Admitting: Orthopaedic Surgery

## 2022-12-08 VITALS — BP 118/69 | HR 74 | Ht 66.25 in | Wt 182.0 lb

## 2022-12-08 DIAGNOSIS — M542 Cervicalgia: Secondary | ICD-10-CM

## 2022-12-08 DIAGNOSIS — M4310 Spondylolisthesis, site unspecified: Secondary | ICD-10-CM

## 2022-12-08 NOTE — Progress Notes (Signed)
Office Visit Note   Patient: Kristina Tran           Date of Birth: 1956/09/08           MRN: [id number] Visit Date: 12/08/2022              Requested by: Michael Boston, MD [address] [place],  [place] [address] PCP: Michael Boston, MD   Assessment & Plan: Visit Diagnoses:  1. Degenerative spondylolisthesis   2. Neck pain     Plan: Will set patient up for some physical therapy.  She requested we review her recent lab report we will call to try to get these results.  Patient can follow-up with me after therapy.  Follow-Up Instructions: Return in about 4 years (around 12/08/2026).   Orders:  No orders of the defined types were placed in this encounter.  No orders of the defined types were placed in this encounter.     Procedures: No procedures performed   Clinical Data: No additional findings.   Subjective: Chief Complaint  Patient presents with   Lower Back - Pain    HPI 67 year old female seen with neck pain shoulder pain, low back pain with known L4-5 degenerative spondylolisthesis.  She has had discomfort in her knees feels like her legs and arms have been weak.  She states she had lab work ordered at [facility] possibly an arthritis panel and was told some of the labs were somewhat abnormal but nothing was terribly positive.  She is continue to have discomfort.  Patient continues to take medication for bipolar disorder.  Lab review shows some creatinine elevation previously 1.59.  She is avoiding anti-inflammatories.  She has had past similar problems that responded to therapy.  Previous CT scan showed some mild cervical spurring which responded to therapy.  Review of Systems no problems with joint erythema or monarticular joint symptoms.  Positive for bilateral knee aching.   Objective: Vital Signs: BP 118/69   Pulse 74   Ht 5' 6.25" (1.683 m)   Wt 182 lb (82.6 kg)   BMI 29.15 kg/m   Physical Exam Constitutional:      Appearance: She is  well-developed.  HENT:     Head: Normocephalic.     Right Ear: External ear normal.     Left Ear: External ear normal. There is no impacted cerumen.  Eyes:     Pupils: Pupils are equal, round, and reactive to light.  Neck:     Thyroid: No thyromegaly.     Trachea: No tracheal deviation.  Cardiovascular:     Rate and Rhythm: Normal rate.  Pulmonary:     Effort: Pulmonary effort is normal.  Abdominal:     Palpations: Abdomen is soft.  Musculoskeletal:     Cervical back: No rigidity.  Skin:    General: Skin is warm and dry.  Neurological:     Mental Status: She is alert and oriented to person, place, and time.  Psychiatric:        Behavior: Behavior normal.     Ortho Exam mild positive impingement minimal brachial plexus tenderness upper and lower extremity reflexes are 2+ symmetrical.  She is slow getting sitting standing she has TED hose for bilateral lower extremity edema.  Some knee crepitus with knee flexion extension decreased quad hamstring hip abductor strength.  Specialty Comments:  No specialty comments available.  Imaging: No results found.   PMFS History: Patient Active Problem List   Diagnosis Date Noted  Neck pain 12/08/2022   Foot sprain, left, initial encounter 02/22/2022   Popliteus tendinitis of left lower extremity 10/18/2021   Degenerative spondylolisthesis 10/18/2021   Trochanteric bursitis, left hip 03/18/2020   Hyperlipidemia 11/25/2013   Bipolar affective disorder ([place]) 10/02/2012   HTN (hypertension) 09/22/2011   Hypothyroidism 09/22/2011   Past Medical History:  Diagnosis Date   Bipolar 1 disorder ([place])    Chest pain    History of echocardiogram    a. Echo 9/17: Mild LVH, EF 60-65, no RWMA, Gr 1 DD, trivial MR, normal RVSF, mild RAE   History of nuclear stress test    a. Nuclear stress test 9/17: EF 75%, no ischemia or scar, normal   Hypertension    Hypothyroidism    Leg edema     Family History  Problem Relation Age of Onset    Breast cancer Mother    Hypertension Mother    Breast cancer Sister     Past Surgical History:  Procedure Laterality Date   BREAST BIOPSY     BREAST EXCISIONAL BIOPSY Right    2000   CARDIOVASCULAR STRESS TEST  07/27/2010   EF 74%. NORMAL   US ECHOCARDIOGRAPHY  09/23/2004   EF 55-60%   WISDOM TOOTH EXTRACTION     Social History   Occupational History   Not on file  Tobacco Use   Smoking status: Former    Types: Cigarettes    Quit date: 11/06/1981    Years since quitting: 41.1   Smokeless tobacco: Former  Substance and Sexual Activity   Alcohol use: No   Drug use: No   Sexual activity: Not on file

## 2023-01-19 ENCOUNTER — Encounter (HOSPITAL_BASED_OUTPATIENT_CLINIC_OR_DEPARTMENT_OTHER): Payer: Self-pay | Admitting: Physical Therapy

## 2023-01-19 ENCOUNTER — Ambulatory Visit (HOSPITAL_BASED_OUTPATIENT_CLINIC_OR_DEPARTMENT_OTHER): Payer: Medicare Other | Attending: Orthopaedic Surgery | Admitting: Physical Therapy

## 2023-01-19 ENCOUNTER — Other Ambulatory Visit: Payer: Self-pay

## 2023-01-19 DIAGNOSIS — M542 Cervicalgia: Secondary | ICD-10-CM

## 2023-01-19 DIAGNOSIS — R2689 Other abnormalities of gait and mobility: Secondary | ICD-10-CM | POA: Diagnosis not present

## 2023-01-19 DIAGNOSIS — M4310 Spondylolisthesis, site unspecified: Secondary | ICD-10-CM | POA: Diagnosis present

## 2023-01-19 DIAGNOSIS — M5459 Other low back pain: Secondary | ICD-10-CM | POA: Diagnosis not present

## 2023-01-19 DIAGNOSIS — M25569 Pain in unspecified knee: Secondary | ICD-10-CM | POA: Insufficient documentation

## 2023-01-19 DIAGNOSIS — M6281 Muscle weakness (generalized): Secondary | ICD-10-CM | POA: Insufficient documentation

## 2023-01-19 NOTE — Therapy (Signed)
OUTPATIENT PHYSICAL THERAPY CERVICAL/Lumbar  EVALUATION   Patient Name: Kristina Tran MRN: [id number] DOB:Mar 27, 1956, 67 y.o., female Today's Date: 01/19/2023  END OF SESSION:  PT End of Session - 01/19/23 1027     Visit Number 1    Number of Visits 16    Date for PT Re-Evaluation 03/16/23    PT Start Time [id number]    PT Stop Time 1058    PT Time Calculation (min) 43 min    Activity Tolerance Patient tolerated treatment well    Behavior During Therapy [facility] for tasks assessed/performed             Past Medical History:  Diagnosis Date   Bipolar 1 disorder ([place])    Chest pain    History of echocardiogram    a. Echo 9/17: Mild LVH, EF 60-65, no RWMA, Gr 1 DD, trivial MR, normal RVSF, mild RAE   History of nuclear stress test    a. Nuclear stress test 9/17: EF 75%, no ischemia or scar, normal   Hypertension    Hypothyroidism    Leg edema    Past Surgical History:  Procedure Laterality Date   BREAST BIOPSY     BREAST EXCISIONAL BIOPSY Right    2000   CARDIOVASCULAR STRESS TEST  07/27/2010   EF 74%. NORMAL   US ECHOCARDIOGRAPHY  09/23/2004   EF 55-60%   WISDOM TOOTH EXTRACTION     Patient Active Problem List   Diagnosis Date Noted   Neck pain 12/08/2022   Foot sprain, left, initial encounter 02/22/2022   Popliteus tendinitis of left lower extremity 10/18/2021   Degenerative spondylolisthesis 10/18/2021   Trochanteric bursitis, left hip 03/18/2020   Hyperlipidemia 11/25/2013   Bipolar affective disorder ([place]) 10/02/2012   HTN (hypertension) 09/22/2011   Hypothyroidism 09/22/2011   PCP: Cristie Hem MD   REFERRING PROVIDER: Dr Rodell Perna   REFERRING DIAG:  Diagnosis  M43.10 (ICD-10-CM) - Degenerative spondylolisthesis  M54.2 (ICD-10-CM) - Neck pain    THERAPY DIAG:  No diagnosis found.  Rationale for Evaluation and Treatment: Rehabilitation  ONSET DATE: Several years but ahas gotten worse   SUBJECTIVE:                                                                                                                                                                                                          SUBJECTIVE STATEMENT: Patient has a long history of multijoint pain.  She has pain in her neck and back at this time.  She has also had periodic pain in her  bilateral hips and knees.  She recently hurt her foot and was in a boot for 8 weeks.  She is now out of the boot.  She has noticed over the past few months when she wakes up in the morning she has significant stiffness and difficulty getting in and out of bed.  PERTINENT HISTORY:  Potential foot fracture recently; Bi-polar; leg swelling  PAIN:  Are you having pain? Yes: NPRS scale: 3/10 Pain location: Low Back  Pain description: aching  Aggravating factors: sleeping/ staying in one position  Relieving factors: Movement   PRECAUTIONS: None  WEIGHT BEARING RESTRICTIONS: No  FALLS:  Has patient fallen in last 6 months? No  LIVING ENVIRONMENT: Has a flight of stair in her house  OCCUPATION:  Retired  Hobbies: Psychologist, forensic cards     PLOF: Independent  Patient goals: To develop exercise program and reduce overall pain  Next visit: Nothing scheduled OBJECTIVE:   DIAGNOSTIC FINDINGS:    PATIENT SURVEYS:  FOTO neck and back  COGNITION: Overall cognitive status: Within functional limits for tasks assessed  SENSATION: Occasional tingling in fingertips POSTURE: rounded shoulders and forward head  PALPATION: Tenderness to palpation in left upper trap; tenderness to palpation in right upper gluteal and lower lumbar spine   CERVICAL ROM:   Active ROM A/PROM (deg) eval  Flexion Full  Extension Full  Right lateral flexion   Left lateral flexion   Right rotation Full  Left rotation Mild pain at end range   (Blank rows = not tested)  UPPER EXTREMITY ROM:  Active ROM Right eval Left eval  Shoulder flexion  Pain at end range  Shoulder extension    Shoulder  abduction    Shoulder adduction    Shoulder extension    Shoulder internal rotation  Full  Shoulder external rotation  Pain at end range  Elbow flexion    Elbow extension    Wrist flexion    Wrist extension    Wrist ulnar deviation    Wrist radial deviation    Wrist pronation    Wrist supination     (Blank rows = not tested)  UPPER EXTREMITY MMT:  MMT Right eval Left eval  Shoulder flexion 4+ 4  Shoulder extension    Shoulder abduction    Shoulder adduction    Shoulder extension    Shoulder internal rotation 4+ 4  Shoulder external rotation 4+ 4  Middle trapezius    Lower trapezius    Elbow flexion    Elbow extension    Wrist flexion    Wrist extension    Wrist ulnar deviation    Wrist radial deviation    Wrist pronation    Wrist supination    Grip strength     (Blank rows = not tested)   LOWER EXTREMITY MMT:    MMT Right eval Left eval  Hip flexion 32.1 29.3  Hip extension    Hip abduction 22.1 22  Hip adduction    Hip internal rotation    Hip external rotation    Knee flexion    Knee extension 33.8 33.8  Ankle dorsiflexion 20.4 21.9  Ankle plantarflexion    Ankle inversion    Ankle eversion     (Blank rows = not tested)   LUMBAR ROM:   Passive  A/PROM  eval  Flexion Pain at end range  Pain at end range   Extension   Right lateral flexion   Left lateral flexion   Right rotation Pain  at end ran  Left rotation    (Blank rows = not tested)  TODAY'S TREATMENT:                                                                                                                              DATE:     Exercises - Supine Lower Trunk Rotation  - 1 x daily - 7 x weekly - 3 sets - 10 reps - Supine Piriformis Stretch with Foot on Ground  - 1 x daily - 7 x weekly - 3 sets - 3 reps - 20  hold - Theracane Over Shoulder  - 1 x daily - 7 x weekly - 3 sets - 10 reps - 1-2 min  hold - Standing Glute Med Mobilization with Small Ball on Wall  - 1 x daily - 7 x  weekly - 3 sets - 10 reps - 1-2 min  hold  Showed patient to pool instructed how it works  PATIENT EDUCATION:  Education details: HEP, symptom Mudlogger,  Person educated: Patient Education method: Explanation, Demonstration, Tactile cues, Verbal cues, and Handouts Education comprehension: verbalized understanding, returned demonstration, verbal cues required, tactile cues required, and needs further education  HOME EXERCISE PROGRAM: Access Code: [email or web address] URL: [email or web address] Date: 01/19/2023 Prepared by: Carolyne Littles  Exercises - Supine Lower Trunk Rotation  - 1 x daily - 7 x weekly - 3 sets - 10 reps - Supine Piriformis Stretch with Foot on Ground  - 1 x daily - 7 x weekly - 3 sets - 3 reps - 20  hold - Theracane Over Shoulder  - 1 x daily - 7 x weekly - 3 sets - 10 reps - 1-2 min  hold - Standing Glute Med Mobilization with Small Ball on Wall  - 1 x daily - 7 x weekly - 3 sets - 10 reps - 1-2 min  hold  ASSESSMENT:  CLINICAL IMPRESSION: Patient is a 67 year old female who  presents with a long history of multijoint pain.  Her pain is more focused and on her left cervical spine lower back at this time.  She has tenderness to palpation in her right upper gluteal and right lower lumbar spine.  She has tenderness to palpation in her left upper trap.  She has some mild pain with end range left shoulder motion.  She has weakness in bilateral gluteals and bilateral quads.  She has increased pain in the mornings when she first gets up.  She has been going to the stretch zone which she feels has helped her pain recently.  She would benefit from skilled therapy to develop gym and aquatic based program to promote general mobility and function. OBJECTIVE IMPAIRMENTS: decreased activity tolerance, difficulty walking, decreased ROM, decreased strength, postural dysfunction, and pain.   ACTIVITY LIMITATIONS: bending, standing, reach over head, and locomotion  level  PARTICIPATION LIMITATIONS: meal prep, cleaning, shopping, community activity, and yard work  PERSONAL FACTORS: 1-2 comorbidities: , recent left  foot injury   are also affecting patient's functional outcome.   REHAB POTENTIAL: Good  CLINICAL DECISION MAKING: Evolving/moderate complexity  EVALUATION COMPLEXITY: Moderate   GOALS: Goals reviewed with patient? Yes  SHORT TERM GOALS: Target date: 02/05/2023    Patient will demonstrate full active left shoulder motion without pain Baseline:  Goal status: INITIAL  2.  Patient will demonstrate full lumbar spine motion without pain Baseline:  Goal status: INITIAL  3.  Patient will increase bilateral lower extremity strength by 5 pounds Baseline:  Goal status: INITIAL  4.  Patient will be independent with basic HEP for cervical spine and lumbar spine Baseline:  Goal status: INITIAL   LONG TERM GOALS: Target date: 03/16/2023    Patient will report no significant pain and stiffness when she wakes up in the morning Baseline:  Goal status: Initial  2.  Patient will play golf without increased neck and back pain Baseline:  Goal status: INITIAL  3.  Patient will have complete gym and aquatic program Baseline:  Goal status: INITIAL    PLAN:  PT FREQUENCY: 2x/week  PT DURATION: 8 weeks  PLANNED INTERVENTIONS: Therapeutic exercises, Therapeutic activity, Neuromuscular re-education, Balance training, Gait training, Patient/Family education, Self Care, Joint mobilization, Aquatic Therapy, Dry Needling, Electrical stimulation, Cryotherapy, Moist heat, Traction, Ultrasound, Ionotophoresis 4mg /ml Dexamethasone, and Manual therapy  PLAN FOR NEXT SESSION: we will begin with a mix of aquatic and land therapy. In the water work on low back and neck strengthening and stretching exercises. On the land consider manual therapy if needed otherwise work on Engineering geologist exercise program for neck and shoulder and progress to gym  program.    Carney Living, PT 01/19/2023, 10:34 AM

## 2023-02-12 ENCOUNTER — Encounter (HOSPITAL_BASED_OUTPATIENT_CLINIC_OR_DEPARTMENT_OTHER): Payer: BLUE CROSS/BLUE SHIELD

## 2023-02-14 ENCOUNTER — Ambulatory Visit (HOSPITAL_BASED_OUTPATIENT_CLINIC_OR_DEPARTMENT_OTHER): Payer: BLUE CROSS/BLUE SHIELD | Admitting: Physical Therapy

## 2023-02-20 ENCOUNTER — Ambulatory Visit (HOSPITAL_BASED_OUTPATIENT_CLINIC_OR_DEPARTMENT_OTHER): Payer: Medicare Other | Attending: Orthopaedic Surgery | Admitting: Physical Therapy

## 2023-02-22 ENCOUNTER — Encounter (HOSPITAL_BASED_OUTPATIENT_CLINIC_OR_DEPARTMENT_OTHER): Payer: BLUE CROSS/BLUE SHIELD | Admitting: Physical Therapy

## 2023-02-26 ENCOUNTER — Encounter (HOSPITAL_BASED_OUTPATIENT_CLINIC_OR_DEPARTMENT_OTHER): Payer: BLUE CROSS/BLUE SHIELD

## 2023-03-02 ENCOUNTER — Ambulatory Visit (HOSPITAL_BASED_OUTPATIENT_CLINIC_OR_DEPARTMENT_OTHER): Payer: BLUE CROSS/BLUE SHIELD | Admitting: Physical Therapy

## 2023-03-07 DEATH — deceased
